# Patient Record
Sex: Female | Born: 1985
Health system: Southern US, Community
[De-identification: ages and names within clinical notes are randomized; demographics above are authoritative.]

## PROBLEM LIST (undated history)

## (undated) DIAGNOSIS — F419 Anxiety disorder, unspecified: Secondary | ICD-10-CM

## (undated) DIAGNOSIS — F32A Depression, unspecified: Secondary | ICD-10-CM

## (undated) DIAGNOSIS — F329 Major depressive disorder, single episode, unspecified: Secondary | ICD-10-CM

## (undated) HISTORY — DX: Anxiety disorder, unspecified: F41.9

## (undated) HISTORY — DX: Depression, unspecified: F32.A

---
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
## 2011-09-14 DIAGNOSIS — R768 Other specified abnormal immunological findings in serum: Secondary | ICD-10-CM | POA: Insufficient documentation

## 2011-09-14 DIAGNOSIS — R7689 Other specified abnormal immunological findings in serum: Secondary | ICD-10-CM | POA: Insufficient documentation

## 2014-05-22 DIAGNOSIS — D509 Iron deficiency anemia, unspecified: Secondary | ICD-10-CM | POA: Insufficient documentation

## 2017-09-24 DIAGNOSIS — F331 Major depressive disorder, recurrent, moderate: Secondary | ICD-10-CM | POA: Insufficient documentation

## 2018-02-20 DIAGNOSIS — N6320 Unspecified lump in the left breast, unspecified quadrant: Secondary | ICD-10-CM | POA: Insufficient documentation

## 2018-03-28 HISTORY — PX: IR FIBRIN GLUE REPAIR ANAL FISTULA: IMG2325

## 2018-03-31 DIAGNOSIS — K603 Anal fistula: Secondary | ICD-10-CM | POA: Insufficient documentation

## 2018-03-31 DIAGNOSIS — K602 Anal fissure, unspecified: Secondary | ICD-10-CM | POA: Insufficient documentation

## 2019-01-27 DIAGNOSIS — L989 Disorder of the skin and subcutaneous tissue, unspecified: Secondary | ICD-10-CM

## 2019-01-27 HISTORY — DX: Disorder of the skin and subcutaneous tissue, unspecified: L98.9

## 2019-06-17 ENCOUNTER — Encounter: Payer: Self-pay | Admitting: Family Medicine

## 2019-06-17 ENCOUNTER — Ambulatory Visit (INDEPENDENT_AMBULATORY_CARE_PROVIDER_SITE_OTHER): Payer: No Typology Code available for payment source | Admitting: Family Medicine

## 2019-06-17 ENCOUNTER — Other Ambulatory Visit: Payer: Self-pay

## 2019-06-17 VITALS — BP 137/83 | HR 84 | Temp 98.7°F | Ht 64.0 in | Wt 165.0 lb

## 2019-06-17 DIAGNOSIS — E538 Deficiency of other specified B group vitamins: Secondary | ICD-10-CM | POA: Diagnosis not present

## 2019-06-17 DIAGNOSIS — F411 Generalized anxiety disorder: Secondary | ICD-10-CM

## 2019-06-17 DIAGNOSIS — L729 Follicular cyst of the skin and subcutaneous tissue, unspecified: Secondary | ICD-10-CM | POA: Insufficient documentation

## 2019-06-17 MED ORDER — CYANOCOBALAMIN 1000 MCG/ML IJ SOLN: 1000.0000 ug | INTRAMUSCULAR | 12 refills | Status: AC

## 2019-06-17 MED ORDER — PROPRANOLOL HCL ER 60 MG PO CP24: 60.0000 mg | ORAL_CAPSULE | Freq: Every day | ORAL | 1 refills | Status: DC

## 2019-06-17 MED FILL — CYANOCOBALAMIN 1,000 MCG/ML: 1000 | 90 days supply | Qty: 3 | Fill #0

## 2019-06-17 MED FILL — PROPRANOLOL HCL ER 60 MG CP: 60 | 90 days supply | Qty: 90 | Fill #0

## 2019-06-17 NOTE — Progress Notes (Signed)
Emily Russell - 34 y.o. female MRN [id number]  Date of birth: 06-25-1985  Subjective Chief Complaint  Patient presents with  . Establish Care    HPI Emily Russell is a 34 y.o. female with history of anxiety and vitamin b12 deficiency.  She is here for initial visit and needs refills on medications.  She self administers b12 injections monthly.  She is doing well with this but is unsure when last levels were checked.    She takes propranolol as needed for anxiety.  This is working well at this time.   She also has area adjacent to R eye that she is concerned about.  First appeared last year with swelling and redness.  Thought to be a cyst and treated with abx.  This improved but never fully resolved.  Still has pain from time to time.  Denies increased swelling.  She would like to consider having this removed.   ROS:  A comprehensive ROS was completed and negative except as noted per HPI  No Known Allergies  Past Medical History:  Diagnosis Date  . Anxiety   . Bumps on skin 01/2019   face near right eye.  . Depression     Past Surgical History:  Procedure Laterality Date  . IR FIBRIN GLUE REPAIR ANAL FISTULA  2020    Social History   Socioeconomic History  . Marital status: Married    Spouse name: Not on file  . Number of children: Not on file  . Years of education: Not on file  . Highest education level: Not on file  Occupational History  . Not on file  Tobacco Use  . Smoking status: Never Smoker  . Smokeless tobacco: Never Used  Substance and Sexual Activity  . Alcohol use: Not Currently  . Drug use: Never  . Sexual activity: Yes    Partners: Male    Birth control/protection: Condom  Other Topics Concern  . Not on file  Social History Narrative  . Not on file   Social Determinants of Health   Financial Resource Strain:   . Difficulty of Paying Living Expenses:   Food Insecurity:   . Worried About Charity fundraiser in the Last Year:   . Arboriculturist  in the Last Year:   Transportation Needs:   . Film/video editor (Medical):   Marland Kitchen Lack of Transportation (Non-Medical):   Physical Activity:   . Days of Exercise per Week:   . Minutes of Exercise per Session:   Stress:   . Feeling of Stress :   Social Connections:   . Frequency of Communication with Friends and Family:   . Frequency of Social Gatherings with Friends and Family:   . Attends Religious Services:   . Active Member of Clubs or Organizations:   . Attends Archivist Meetings:   Marland Kitchen Marital Status:     Family History  Problem Relation Age of Onset  . Hypertension Mother   . Lupus Father   . Stroke Paternal Grandmother   . Diabetes Paternal Grandfather     Health Maintenance  Topic Date Due  . PAP SMEAR-Modifier  03/28/2020  . TETANUS/TDAP  06/16/2025  . INFLUENZA VACCINE  Completed  . HIV Screening  Completed     ----------------------------------------------------------------------------------------------------------------------------------------------------------------------------------------------------------------- Physical Exam BP 137/83   Pulse 84   Temp 98.7 F (37.1 C) (Oral)   Ht 5\' 4"  (1.626 m)   Wt 165 lb (74.8 kg)   LMP 06/04/2019  BMI 28.32 kg/m   Physical Exam Constitutional:      Appearance: Normal appearance.  Eyes:     General: No scleral icterus. Cardiovascular:     Rate and Rhythm: Normal rate.  Pulmonary:     Effort: Pulmonary effort is normal.     Breath sounds: Normal breath sounds.  Musculoskeletal:     Cervical back: Neck supple.  Skin:    General: Skin is warm and dry.     Comments: Small cystic like lesion adjacent to R eye.  Mild ttp to palpation.  No induration or fluctuance.    Neurological:     Mental Status: She is alert.  Psychiatric:        Mood and Affect: Mood normal.        Behavior: Behavior normal.      ------------------------------------------------------------------------------------------------------------------------------------------------------------------------------------------------------------------- Assessment and Plan  Cyst of skin She desires removal due to recurrent pain and irritation.  Referral entered to plastic surgery.   B12 deficiency Due for updated labs.  B12, MMA and Homocysteine ordered  Renewal for B12 for injection sent in.   GAD (generalized anxiety disorder) This is well controlled with propranolol, continue at current dose.  Rx renewed.    Meds ordered this encounter  Medications  . propranolol ER (INDERAL LA) 60 MG 24 hr capsule    Sig: Take 1 capsule (60 mg total) by mouth daily.    Dispense:  90 capsule    Refill:  1  . cyanocobalamin (,VITAMIN B-12,) 1000 MCG/ML injection    Sig: Inject 1 mL (1,000 mcg total) into the muscle every 30 (thirty) days.    Dispense:  1 mL    Refill:  12    No follow-ups on file.    This visit occurred during the SARS-CoV-2 public health emergency.  Safety protocols were in place, including screening questions prior to the visit, additional usage of staff PPE, and extensive cleaning of exam room while observing appropriate contact time as indicated for disinfecting solutions.

## 2019-06-17 NOTE — Assessment & Plan Note (Signed)
Due for updated labs.  B12, MMA and Homocysteine ordered  Renewal for B12 for injection sent in.

## 2019-06-17 NOTE — Patient Instructions (Signed)
Great to meet you! We'll be in touch with lab results You should be contacted by plastic surgery to area on eye removed.

## 2019-06-17 NOTE — Assessment & Plan Note (Signed)
She desires removal due to recurrent pain and irritation.  Referral entered to plastic surgery.

## 2019-06-17 NOTE — Assessment & Plan Note (Signed)
This is well controlled with propranolol, continue at current dose.  Rx renewed.

## 2019-06-20 LAB — HOMOCYSTEINE: Homocysteine: 8.9 umol/L (ref <10.4)

## 2019-06-20 LAB — METHYLMALONIC ACID, SERUM: Methylmalonic Acid, Quant: 151 nmol/L (ref 87–318)

## 2019-06-20 LAB — VITAMIN B12: Vitamin B-12: 527 pg/mL (ref 200–1100)

## 2019-06-27 ENCOUNTER — Encounter: Payer: Self-pay | Admitting: Family Medicine

## 2019-07-17 ENCOUNTER — Institutional Professional Consult (permissible substitution): Payer: No Typology Code available for payment source | Admitting: Plastic Surgery

## 2019-08-14 ENCOUNTER — Other Ambulatory Visit: Payer: Self-pay

## 2019-08-14 ENCOUNTER — Encounter: Payer: Self-pay | Admitting: Plastic Surgery

## 2019-08-14 ENCOUNTER — Ambulatory Visit (INDEPENDENT_AMBULATORY_CARE_PROVIDER_SITE_OTHER): Payer: No Typology Code available for payment source | Admitting: Plastic Surgery

## 2019-08-14 VITALS — BP 125/86 | HR 83 | Temp 97.8°F | Ht 64.0 in | Wt 163.4 lb

## 2019-08-14 DIAGNOSIS — L989 Disorder of the skin and subcutaneous tissue, unspecified: Secondary | ICD-10-CM | POA: Diagnosis not present

## 2019-08-14 NOTE — Progress Notes (Signed)
   Referring Provider Luetta Nutting, [address] [place],  [place] [id number]   CC:  Chief Complaint  Patient presents with  . Consult    cyst on right side of face      Emily Russell is an 34 y.o. female.  HPI: Patient presents to discuss a cyst in the right lateral canthus area.  Is been present for least 6 months.  Was initially bigger and then she got a course of antibiotics and his decreased in size but it still bothers her.  Intermittently painful.  No Known Allergies  Outpatient Encounter Medications as of 08/14/2019  Medication Sig  . Ascorbic Acid (VITAMIN C) 1000 MG tablet Take 1,100 mg by mouth daily.  . Cholecalciferol 125 MCG (5000 UT) capsule Take 5,000 Units by mouth daily.  . cyanocobalamin (,VITAMIN B-12,) 1000 MCG/ML injection Inject 1 mL (1,000 mcg total) into the muscle every 30 (thirty) days.  . Menaquinone-7 (VITAMIN K2) 100 MCG CAPS Take 1 capsule by mouth daily.  . Omega-3 Fatty Acids (FISH OIL) 1000 MG CAPS Take 1 capsule by mouth daily.  . propranolol ER (INDERAL LA) 60 MG 24 hr capsule Take 1 capsule (60 mg total) by mouth daily.  . Turmeric (QC TUMERIC COMPLEX PO) Take 1 tablet by mouth daily.   No facility-administered encounter medications on file as of 08/14/2019.     Past Medical History:  Diagnosis Date  . Anxiety   . Bumps on skin 01/2019   face near right eye.  . Depression     Past Surgical History:  Procedure Laterality Date  . IR FIBRIN GLUE REPAIR ANAL FISTULA  2020    Family History  Problem Relation Age of Onset  . Hypertension Mother   . Lupus Father   . Stroke Paternal Grandmother   . Diabetes Paternal Grandfather     Social History   Social History Narrative  . Not on file     Review of Systems General: Denies fevers, chills, weight loss CV: Denies chest pain, shortness of breath, palpitations  Physical Exam Vitals with BMI 08/14/2019 06/17/2019  Height 5\' 4"  5\' 4"   Weight 163 lbs 6 oz  165 lbs  BMI [id number]  Systolic 0000000 0000000  Diastolic 86 83  Pulse 83 84    General:  No acute distress,  Alert and oriented, Non-Toxic, Normal speech and affect Patient has a 1 cm diameter subcutaneous cystic lesion in the right lateral canthus/zygomatic area.  There is some erythematous skin superficially.  There is no drainage or obvious punctum that I can see.  Assessment/Plan Patient presents with what looks like a benign epidermal inclusion cyst.  I explained that excision would be the only treatment that would be definitive in my opinion.  I think it is a great time to do it now as it is in a quiesced sent phase prior to it increasing in size again.  I discussed the risk that include bleeding, infection, damage to surrounding structures, and need for additional procedures.  I discussed the potential for recurrence.  We will plan to organize this to be done under local.  Cindra Presume 08/14/2019, 3:41 PM

## 2019-08-19 ENCOUNTER — Other Ambulatory Visit: Payer: Self-pay

## 2019-08-19 ENCOUNTER — Other Ambulatory Visit (HOSPITAL_COMMUNITY)
Admission: RE | Admit: 2019-08-19 | Discharge: 2019-08-19 | Disposition: A | Discharge status: Home / Self Care | Payer: No Typology Code available for payment source | Source: Ambulatory Visit | Attending: Plastic Surgery | Admitting: Plastic Surgery

## 2019-08-19 ENCOUNTER — Ambulatory Visit (INDEPENDENT_AMBULATORY_CARE_PROVIDER_SITE_OTHER): Payer: No Typology Code available for payment source | Admitting: Plastic Surgery

## 2019-08-19 DIAGNOSIS — L989 Disorder of the skin and subcutaneous tissue, unspecified: Secondary | ICD-10-CM

## 2019-08-19 NOTE — Progress Notes (Signed)
Operative Note   DATE OF OPERATION: 08/19/2019  LOCATION:    SURGICAL DEPARTMENT: Plastic Surgery  PREOPERATIVE DIAGNOSES: Right cheek cyst  POSTOPERATIVE DIAGNOSES:  same  PROCEDURE:  1. Excision of right cheek cyst measuring 2 cm 2. Complex closure measuring 2 cm  SURGEON: Talmadge Coventry, MD  ANESTHESIA:  Local  COMPLICATIONS: None.   INDICATIONS FOR PROCEDURE:  The patient, Emily Russell is a 34 y.o. female born on Mar 17, 1986, is here for treatment of right cheek cyst MRN: [id number]  CONSENT:  Informed consent was obtained directly from the patient. Risks, benefits and alternatives were fully discussed. Specific risks including but not limited to bleeding, infection, hematoma, seroma, scarring, pain, infection, wound healing problems, and need for further surgery were all discussed. The patient did have an ample opportunity to have questions answered to satisfaction.   DESCRIPTION OF PROCEDURE:  Local anesthesia was administered. The patient's operative site was prepped and draped in a sterile fashion. A time out was performed and all information was confirmed to be correct.  The lesion was excised with a 15 blade.  Hemostasis was obtained.  Circumferential undermining was performed and the skin was advanced and closed in layers with interrupted buried Monocryl sutures and 5-0 fast gut for the skin.  The lesion excised measured 2 cm, and the total length of closure measured 2 cm.    The patient tolerated the procedure well.  There were no complications.

## 2019-08-21 LAB — SURGICAL PATHOLOGY

## 2019-08-29 ENCOUNTER — Telehealth: Payer: Self-pay | Admitting: *Deleted

## 2019-08-29 NOTE — Telephone Encounter (Signed)
Received Medical Management Request via of fax from [facility] on (08/26/19). To be completed.  Given to provider to complete.   Request completed and faxed.  Confirmation received.  Copy scanned into the chart.//AB/CMA

## 2019-09-02 NOTE — Progress Notes (Signed)
Patient is a 34 year old female who underwent excision of right cheek cyst (2 cm) with complex closure on 08/19/2019 with Dr. Claudia Desanctis.  Pathology results: Epidermoid inclusion cyst with rupture.  There is no evidence of malignancy.  ~2 weeks PO Patient reports no issues at this time. Incision healing well,c/d/i. No signs of infection.  Follow up as needed. Call office with any questions/concerns.

## 2019-09-04 ENCOUNTER — Encounter: Payer: Self-pay | Admitting: Plastic Surgery

## 2019-09-04 ENCOUNTER — Ambulatory Visit (INDEPENDENT_AMBULATORY_CARE_PROVIDER_SITE_OTHER): Payer: No Typology Code available for payment source | Admitting: Plastic Surgery

## 2019-09-04 ENCOUNTER — Other Ambulatory Visit: Payer: Self-pay

## 2019-09-04 VITALS — BP 120/83 | HR 79 | Temp 97.8°F

## 2019-09-04 DIAGNOSIS — L729 Follicular cyst of the skin and subcutaneous tissue, unspecified: Secondary | ICD-10-CM

## 2019-09-05 ENCOUNTER — Encounter: Payer: Self-pay | Admitting: Plastic Surgery

## 2019-09-11 ENCOUNTER — Other Ambulatory Visit: Payer: Self-pay

## 2019-09-11 ENCOUNTER — Encounter: Payer: Self-pay | Admitting: Family Medicine

## 2019-09-11 ENCOUNTER — Ambulatory Visit (INDEPENDENT_AMBULATORY_CARE_PROVIDER_SITE_OTHER): Payer: No Typology Code available for payment source | Admitting: Family Medicine

## 2019-09-11 VITALS — BP 134/77 | Wt 164.0 lb

## 2019-09-11 DIAGNOSIS — R197 Diarrhea, unspecified: Secondary | ICD-10-CM | POA: Diagnosis not present

## 2019-09-11 DIAGNOSIS — R109 Unspecified abdominal pain: Secondary | ICD-10-CM

## 2019-09-11 MED ORDER — DICYCLOMINE HCL 10 MG PO CAPS: 10.0000 mg | ORAL_CAPSULE | Freq: Three times a day (TID) | ORAL | 0 refills | Status: AC | PRN

## 2019-09-11 MED FILL — DICYCLOMINE 10 MG CAPSULE: 10 | 20 days supply | Qty: 60 | Fill #0

## 2019-09-11 NOTE — Patient Instructions (Signed)
Nice to see you! Have labs completed, we'll be in touch with results.  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  Try dicyclomine for cramps if symptoms re-occur

## 2019-09-11 NOTE — Progress Notes (Signed)
Emily Russell - 34 y.o. female MRN [id number]  Date of birth: 12-Jan-1986  Subjective Chief Complaint  Patient presents with  . Abdominal Pain    HPI Emily Russell is a 34 y.o. female here today with complaint of recurrent abdominal pain and diarrhea. She reports that symptoms started after COVID-19 infection in December.  She has had episodes of abdominal cramping with diarrhea nearly every month since having COVID.  She had some blood in her stool after she had first episode in December and had GI profile that returned negative.  She does have some associated nausea sometimes.  She denies fever, chills, weight change or changes to appetite. She has not had blood in her stool since initial episode.    ROS:  A comprehensive ROS was completed and negative except as noted per HPI   No Known Allergies  Past Medical History:  Diagnosis Date  . Anxiety   . Bumps on skin 01/2019   face near right eye.  . Depression     Past Surgical History:  Procedure Laterality Date  . IR FIBRIN GLUE REPAIR ANAL FISTULA  2020    Social History   Socioeconomic History  . Marital status: Married    Spouse name: Not on file  . Number of children: Not on file  . Years of education: Not on file  . Highest education level: Not on file  Occupational History  . Not on file  Tobacco Use  . Smoking status: Never Smoker  . Smokeless tobacco: Never Used  Substance and Sexual Activity  . Alcohol use: Not Currently  . Drug use: Never  . Sexual activity: Yes    Partners: Male    Birth control/protection: Condom  Other Topics Concern  . Not on file  Social History Narrative  . Not on file   Social Determinants of Health   Financial Resource Strain:   . Difficulty of Paying Living Expenses:   Food Insecurity:   . Worried About Charity fundraiser in the Last Year:   . Arboriculturist in the Last Year:   Transportation Needs:   . Film/video editor (Medical):   Marland Kitchen Lack of Transportation  (Non-Medical):   Physical Activity:   . Days of Exercise per Week:   . Minutes of Exercise per Session:   Stress:   . Feeling of Stress :   Social Connections:   . Frequency of Communication with Friends and Family:   . Frequency of Social Gatherings with Friends and Family:   . Attends Religious Services:   . Active Member of Clubs or Organizations:   . Attends Archivist Meetings:   Marland Kitchen Marital Status:     Family History  Problem Relation Age of Onset  . Hypertension Mother   . Lupus Father   . Stroke Paternal Grandmother   . Diabetes Paternal Grandfather     Health Maintenance  Topic Date Due  . Hepatitis C Screening  Never done  . INFLUENZA VACCINE  10/27/2019  . PAP SMEAR-Modifier  03/28/2020  . TETANUS/TDAP  06/16/2025  . HIV Screening  Completed     ----------------------------------------------------------------------------------------------------------------------------------------------------------------------------------------------------------------- Physical Exam BP 134/77   Wt 164 lb (74.4 kg)   LMP 08/21/2019 (Approximate)   SpO2 100%   BMI 28.15 kg/m   Physical Exam Constitutional:      Appearance: She is well-developed.  HENT:     Head: Normocephalic and atraumatic.  Eyes:     General: No scleral icterus. Cardiovascular:  Rate and Rhythm: Normal rate and regular rhythm.  Pulmonary:     Effort: Pulmonary effort is normal.     Breath sounds: Normal breath sounds.  Abdominal:     General: Abdomen is flat. There is no distension.     Palpations: Abdomen is soft.     Tenderness: There is no abdominal tenderness. There is no guarding.  Musculoskeletal:     Cervical back: Neck supple.  Skin:    General: Skin is warm and dry.  Neurological:     General: No focal deficit present.     Mental Status: She is alert.  Psychiatric:        Mood and Affect: Mood normal.      ------------------------------------------------------------------------------------------------------------------------------------------------------------------------------------------------------------------- Assessment and Plan  Recurrent abdominal pain Associated with diarrhea Checking inflammatory markers including ESR, crp and calprotectin.  Will check for infectious etiology including stool culture, c.diff and O&P.  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 Will add bentyl as needed for cramping.  Next step would be GI referral if labs return normal.     Meds ordered this encounter  Medications  . dicyclomine (BENTYL) 10 MG capsule    Sig: Take 1 capsule (10 mg total) by mouth 3 (three) times daily as needed for spasms.    Dispense:  60 capsule    Refill:  0    No follow-ups on file.    This visit occurred during the SARS-CoV-2 public health emergency.  Safety protocols were in place, including screening questions prior to the visit, additional usage of staff PPE, and extensive cleaning of exam room while observing appropriate contact time as indicated for disinfecting solutions.

## 2019-09-11 NOTE — Assessment & Plan Note (Signed)
Associated with diarrhea Checking inflammatory markers including ESR, crp and calprotectin.  Will check for infectious etiology including stool culture, c.diff and O&P.  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 Will add bentyl as needed for cramping.  Next step would be GI referral if labs return normal.

## 2019-09-12 LAB — SEDIMENTATION RATE: Sed Rate: 2 mm/h (ref 0–20)

## 2019-09-12 LAB — C-REACTIVE PROTEIN: CRP: 0.6 mg/L (ref <8.0)

## 2019-09-19 LAB — STOOL CULTURE
MICRO NUMBER:: 10604948
MICRO NUMBER:: 10604949
MICRO NUMBER:: 10604950
SHIGA RESULT:: NOT DETECTED
SPECIMEN QUALITY:: ADEQUATE
SPECIMEN QUALITY:: ADEQUATE
SPECIMEN QUALITY:: ADEQUATE

## 2019-09-19 LAB — OVA AND PARASITE EXAMINATION
CONCENTRATE RESULT:: NONE SEEN
MICRO NUMBER:: 10604246
SPECIMEN QUALITY:: ADEQUATE
TRICHROME RESULT:: NONE SEEN

## 2019-09-19 LAB — C. DIFFICILE GDH AND TOXIN A/B
GDH ANTIGEN: NOT DETECTED
MICRO NUMBER:: 10606708
SPECIMEN QUALITY:: ADEQUATE
TOXIN A AND B: NOT DETECTED

## 2019-09-19 LAB — CALPROTECTIN: Calprotectin: 6 mcg/g

## 2019-10-21 ENCOUNTER — Encounter: Payer: Self-pay | Admitting: Family Medicine

## 2020-02-11 ENCOUNTER — Other Ambulatory Visit: Payer: Self-pay

## 2020-02-11 ENCOUNTER — Encounter: Payer: Self-pay | Admitting: Family Medicine

## 2020-02-11 ENCOUNTER — Ambulatory Visit (INDEPENDENT_AMBULATORY_CARE_PROVIDER_SITE_OTHER): Payer: No Typology Code available for payment source | Admitting: Family Medicine

## 2020-02-11 VITALS — BP 135/78 | HR 87 | Temp 98.0°F | Wt 164.3 lb

## 2020-02-11 DIAGNOSIS — R002 Palpitations: Secondary | ICD-10-CM

## 2020-02-11 NOTE — Progress Notes (Signed)
Emily Russell - 34 y.o. female MRN [id number]  Date of birth: [birth year]  Subjective Chief Complaint  Patient presents with  . Palpitations    HPI Emily Russell is a 34 y.o. female here today with complaint of palpitations.  She has had these before and had EKG with holter monitor in 2017.  She had occasional PVC's on holter.  She describes current symptoms as occasional flutter.  No tachycardia symptoms, dizziness, shortness of breath, or nausea.  Feels like she has to clear her throat when she has these.  She has been prescribed propranolol previously as needed for this and anxiety but hasn't been using recently.  She does not consume very many caffeine containing products, doesn't feel anxious and is sleeping well.    ROS:  A comprehensive ROS was completed and negative except as noted per HPI  No Known Allergies  Past Medical History:  Diagnosis Date  . Anxiety   . Bumps on skin 01/2019   face near right eye.  . Depression     Past Surgical History:  Procedure Laterality Date  . IR FIBRIN GLUE REPAIR ANAL FISTULA  2020    Social History   Socioeconomic History  . Marital status: Married    Spouse name: Not on file  . Number of children: Not on file  . Years of education: Not on file  . Highest education level: Not on file  Occupational History  . Not on file  Tobacco Use  . Smoking status: Never Smoker  . Smokeless tobacco: Never Used  Substance and Sexual Activity  . Alcohol use: Not Currently  . Drug use: Never  . Sexual activity: Yes    Partners: Male    Birth control/protection: Condom  Other Topics Concern  . Not on file  Social History Narrative  . Not on file   Social Determinants of Health   Financial Resource Strain:   . Difficulty of Paying Living Expenses: Not on file  Food Insecurity:   . Worried About Charity fundraiser in the Last Year: Not on file  . Ran Out of Food in the Last Year: Not on file  Transportation Needs:   . Lack of  Transportation (Medical): Not on file  . Lack of Transportation (Non-Medical): Not on file  Physical Activity:   . Days of Exercise per Week: Not on file  . Minutes of Exercise per Session: Not on file  Stress:   . Feeling of Stress : Not on file  Social Connections:   . Frequency of Communication with Friends and Family: Not on file  . Frequency of Social Gatherings with Friends and Family: Not on file  . Attends Religious Services: Not on file  . Active Member of Clubs or Organizations: Not on file  . Attends Archivist Meetings: Not on file  . Marital Status: Not on file    Family History  Problem Relation Age of Onset  . Hypertension Mother   . Lupus Father   . Stroke Paternal Grandmother   . Diabetes Paternal Grandfather     Health Maintenance  Topic Date Due  . Hepatitis C Screening  Never done  . INFLUENZA VACCINE  10/27/2019  . PAP SMEAR-Modifier  03/28/2020  . TETANUS/TDAP  06/16/2025  . HIV Screening  Completed     ----------------------------------------------------------------------------------------------------------------------------------------------------------------------------------------------------------------- Physical Exam BP 135/78 (BP Location: Left Arm, Patient Position: Sitting, Cuff Size: Normal)   Pulse 87   Temp 98 F (36.7 C)   Wt 164  lb 4.8 oz (74.5 kg)   SpO2 100%   BMI 28.20 kg/m   Physical Exam Constitutional:      Appearance: Normal appearance.  HENT:     Head: Normocephalic and atraumatic.  Eyes:     General: No scleral icterus. Cardiovascular:     Rate and Rhythm: Normal rate and regular rhythm.     Comments: Occasional ectopic beat  Pulmonary:     Effort: Pulmonary effort is normal.     Breath sounds: Normal breath sounds.  Skin:    General: Skin is warm and dry.  Neurological:     General: No focal deficit present.     Mental Status: She is alert.  Psychiatric:        Mood and Affect: Mood normal.         Behavior: Behavior normal.    EKG: NSR, rate of 90.  Normal PR and QTc intervals.  ------------------------------------------------------------------------------------------------------------------------------------------------------------------------------------------------------------------- Assessment and Plan  Palpitations Check cmp, cbc and tsh today.  EKG reassuring  She will use propranolol as needed.  Follow up if remains persistent or occurs more often.     No orders of the defined types were placed in this encounter.   No follow-ups on file.    This visit occurred during the SARS-CoV-2 public health emergency.  Safety protocols were in place, including screening questions prior to the visit, additional usage of staff PPE, and extensive cleaning of exam room while observing appropriate contact time as indicated for disinfecting solutions.

## 2020-02-11 NOTE — Assessment & Plan Note (Signed)
Check cmp, cbc and tsh today.  EKG reassuring  She will use propranolol as needed.  Follow up if remains persistent or occurs more often.

## 2020-02-11 NOTE — Patient Instructions (Signed)
Have labs completed Try propranolol as needed.  Let me know if this becomes worse or continuous.

## 2020-02-12 LAB — CBC
HCT: 37 % (ref 35.0–45.0)
Hemoglobin: 12.2 g/dL (ref 11.7–15.5)
MCH: 27.1 pg (ref 27.0–33.0)
MCHC: 33 g/dL (ref 32.0–36.0)
MCV: 82.2 fL (ref 80.0–100.0)
MPV: 10.5 fL (ref 7.5–12.5)
Platelets: 286 10*3/uL (ref 140–400)
RBC: 4.5 10*6/uL (ref 3.80–5.10)
RDW: 13 % (ref 11.0–15.0)
WBC: 5.3 10*3/uL (ref 3.8–10.8)

## 2020-02-12 LAB — COMPLETE METABOLIC PANEL WITH GFR
AG Ratio: 1.9 (calc) (ref 1.0–2.5)
ALT: 8 U/L (ref 6–29)
AST: 10 U/L (ref 10–30)
Albumin: 4.2 g/dL (ref 3.6–5.1)
Alkaline phosphatase (APISO): 46 U/L (ref 31–125)
BUN: 9 mg/dL (ref 7–25)
CO2: 27 mmol/L (ref 20–32)
Calcium: 9.3 mg/dL (ref 8.6–10.2)
Chloride: 106 mmol/L (ref 98–110)
Creat: 0.78 mg/dL (ref 0.50–1.10)
GFR, Est African American: 115 mL/min/{1.73_m2} (ref >=60)
GFR, Est Non African American: 99 mL/min/{1.73_m2} (ref >=60)
Globulin: 2.2 g/dL (calc) (ref 1.9–3.7)
Glucose, Bld: 96 mg/dL (ref 65–99)
Potassium: 4.4 mmol/L (ref 3.5–5.3)
Sodium: 140 mmol/L (ref 135–146)
Total Bilirubin: 0.4 mg/dL (ref 0.2–1.2)
Total Protein: 6.4 g/dL (ref 6.1–8.1)

## 2020-02-12 LAB — TSH: TSH: 0.72 mIU/L

## 2020-02-12 NOTE — Addendum Note (Signed)
Addended by: Narda Rutherford on: 02/12/2020 07:33 AM   Modules accepted: Orders

## 2020-02-17 ENCOUNTER — Encounter: Payer: Self-pay | Admitting: Family Medicine

## 2020-02-17 ENCOUNTER — Other Ambulatory Visit: Payer: Self-pay

## 2020-02-17 MED ORDER — PROPRANOLOL HCL ER 60 MG PO CP24: 60.0000 mg | ORAL_CAPSULE | Freq: Every day | ORAL | 1 refills | Status: AC

## 2020-02-17 MED ORDER — PROPRANOLOL HCL ER 60 MG PO CP24
60.0000 mg | ORAL_CAPSULE | Freq: Every day | ORAL | 1 refills | Status: DC
Start: 2020-02-17 — End: 2020-02-17

## 2020-03-09 ENCOUNTER — Other Ambulatory Visit: Payer: Self-pay | Admitting: Sports Medicine

## 2020-03-09 ENCOUNTER — Ambulatory Visit (INDEPENDENT_AMBULATORY_CARE_PROVIDER_SITE_OTHER): Payer: No Typology Code available for payment source

## 2020-03-09 ENCOUNTER — Ambulatory Visit (INDEPENDENT_AMBULATORY_CARE_PROVIDER_SITE_OTHER): Payer: No Typology Code available for payment source | Admitting: Sports Medicine

## 2020-03-09 ENCOUNTER — Other Ambulatory Visit: Payer: Self-pay

## 2020-03-09 DIAGNOSIS — M79601 Pain in right arm: Secondary | ICD-10-CM

## 2020-03-09 DIAGNOSIS — M7521 Bicipital tendinitis, right shoulder: Secondary | ICD-10-CM | POA: Insufficient documentation

## 2020-03-09 DIAGNOSIS — M50322 Other cervical disc degeneration at C5-C6 level: Secondary | ICD-10-CM | POA: Diagnosis not present

## 2020-03-09 DIAGNOSIS — M25521 Pain in right elbow: Secondary | ICD-10-CM | POA: Diagnosis not present

## 2020-03-09 DIAGNOSIS — M25511 Pain in right shoulder: Secondary | ICD-10-CM | POA: Diagnosis not present

## 2020-03-09 MED ORDER — MELOXICAM 15 MG PO TABS: ORAL_TABLET | ORAL | 3 refills | Status: DC

## 2020-03-09 MED ORDER — MELOXICAM 15 MG PO TABS: ORAL_TABLET | ORAL | 3 refills | Status: AC

## 2020-03-09 MED FILL — MELOXICAM 15 MG TABLET: 15 | 30 days supply | Qty: 30 | Fill #0

## 2020-03-09 NOTE — Addendum Note (Signed)
Addended by: Silverio Decamp on: 03/09/2020 03:16 PM   Modules accepted: Orders

## 2020-03-09 NOTE — Progress Notes (Signed)
    Procedures performed today:    None.  Independent interpretation of notes and tests performed by another provider:   None.  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 For the past 6 weeks she has had pain in her anterior upper arm with radiation up to the shoulder and distally to the wrist. Her exam is almost completely benign, neck exam is unremarkable, negative Spurling sign, good motion, good strength. Shoulder exam is unremarkable with the exception of a very mildly positive speeds test. Elbow exam is unremarkable with the exception of a mildly positive speeds test. Wrist exam is completely unremarkable, negative Tinel's, Phalen sign, good motion, good strength, negative Finkelstein test. I do think she has very mild distal biceps tendinitis, we talked about ergonomic improvements to her workstation, I will add meloxicam, neck, shoulder, elbow x-rays, as well as biceps tendinitis rehab exercises, return to see me in 4 to 6 weeks, MRI of either the shoulder or elbow if no better.    ___________________________________________ Gwen Her. Dianah Field, M.D., ABFM., CAQSM. Primary Care and [place] [employer] of [employer] [facility]

## 2020-03-09 NOTE — Assessment & Plan Note (Signed)
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 For the past 6 weeks she has had pain in her anterior upper arm with radiation up to the shoulder and distally to the wrist. Her exam is almost completely benign, neck exam is unremarkable, negative Spurling sign, good motion, good strength. Shoulder exam is unremarkable with the exception of a very mildly positive speeds test. Elbow exam is unremarkable with the exception of a mildly positive speeds test. Wrist exam is completely unremarkable, negative Tinel's, Phalen sign, good motion, good strength, negative Finkelstein test. I do think she has very mild distal biceps tendinitis, we talked about ergonomic improvements to her workstation, I will add meloxicam, neck, shoulder, elbow x-rays, as well as biceps tendinitis rehab exercises, return to see me in 4 to 6 weeks, MRI of either the shoulder or elbow if no better.

## 2020-04-06 ENCOUNTER — Ambulatory Visit: Payer: No Typology Code available for payment source | Admitting: Sports Medicine

## 2020-04-10 ENCOUNTER — Ambulatory Visit: Payer: Self-pay | Admitting: Sports Medicine

## 2020-04-13 ENCOUNTER — Ambulatory Visit: Payer: Self-pay | Admitting: Sports Medicine

## 2020-04-17 ENCOUNTER — Ambulatory Visit: Payer: Self-pay | Admitting: Sports Medicine

## 2020-04-28 ENCOUNTER — Other Ambulatory Visit: Payer: Self-pay

## 2020-04-28 ENCOUNTER — Ambulatory Visit (INDEPENDENT_AMBULATORY_CARE_PROVIDER_SITE_OTHER): Payer: 59 | Admitting: Sports Medicine

## 2020-04-28 ENCOUNTER — Ambulatory Visit (INDEPENDENT_AMBULATORY_CARE_PROVIDER_SITE_OTHER): Payer: 59

## 2020-04-28 DIAGNOSIS — M7521 Bicipital tendinitis, right shoulder: Secondary | ICD-10-CM

## 2020-04-28 DIAGNOSIS — M79601 Pain in right arm: Secondary | ICD-10-CM

## 2020-04-28 NOTE — Progress Notes (Signed)
    Procedures performed today:    Procedure: Real-time Ultrasound Guided injection of the right proximal biceps sheath Device: Samsung HS60  Verbal informed consent obtained.  Time-out conducted.  Noted no overlying erythema, induration, or other signs of local infection.  Skin prepped in a sterile fashion.  Local anesthesia: Topical Ethyl chloride.  With sterile technique and under real time ultrasound guidance:  Noted normal-appearing bicipital groove, 1 cc Kenalog 40, 1 cc lidocaine, 1 cc bupivacaine injected easily Completed without difficulty  Advised to call if fevers/chills, erythema, induration, drainage, or persistent bleeding.  Images permanently stored and available for review in PACS.  Impression: Technically successful ultrasound guided injection.  Independent interpretation of notes and tests performed by another provider:   None.  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 We went through some conservative treatment meloxicam, x-rays, continues to have pain. Today she has a more strongly positive speeds test, I did perform a proximal biceps sheath injection today, return to see me in a month.    ___________________________________________ Gwen Her. Dianah Field, M.D., ABFM., CAQSM. Primary Care and [place] Instructor of [place] [facility]

## 2020-04-28 NOTE — Assessment & Plan Note (Signed)
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 We went through some conservative treatment meloxicam, x-rays, continues to have pain. Today she has a more strongly positive speeds test, I did perform a proximal biceps sheath injection today, return to see me in a month.

## 2020-05-11 DIAGNOSIS — R5383 Other fatigue: Secondary | ICD-10-CM | POA: Diagnosis not present

## 2020-05-11 DIAGNOSIS — E639 Nutritional deficiency, unspecified: Secondary | ICD-10-CM | POA: Diagnosis not present

## 2020-05-11 DIAGNOSIS — E559 Vitamin D deficiency, unspecified: Secondary | ICD-10-CM | POA: Diagnosis not present

## 2020-05-15 DIAGNOSIS — J029 Acute pharyngitis, unspecified: Secondary | ICD-10-CM | POA: Diagnosis not present

## 2020-05-15 DIAGNOSIS — R0982 Postnasal drip: Secondary | ICD-10-CM | POA: Diagnosis not present

## 2020-05-19 ENCOUNTER — Other Ambulatory Visit: Payer: Self-pay

## 2020-05-19 ENCOUNTER — Encounter: Payer: Self-pay | Admitting: Medical-Surgical

## 2020-05-19 ENCOUNTER — Ambulatory Visit (INDEPENDENT_AMBULATORY_CARE_PROVIDER_SITE_OTHER): Payer: 59 | Admitting: Medical-Surgical

## 2020-05-19 VITALS — BP 141/91 | HR 109 | Temp 98.9°F | Resp 20 | Ht 64.0 in | Wt 159.0 lb

## 2020-05-19 DIAGNOSIS — N939 Abnormal uterine and vaginal bleeding, unspecified: Secondary | ICD-10-CM | POA: Diagnosis not present

## 2020-05-19 DIAGNOSIS — N926 Irregular menstruation, unspecified: Secondary | ICD-10-CM

## 2020-05-19 LAB — POCT URINE PREGNANCY: Preg Test, Ur: NEGATIVE

## 2020-05-19 NOTE — Progress Notes (Signed)
Subjective:    CC: prolonged uterine bleeding  HPI: Pleasant 35 year old female presenting with reports of prolonged uterine bleeding. Started her menses 1 week early on 03/29/2020 and has been bleeding since. Started with a light flow that has steadily increased until her flow is heavy, bright red with clots. She is using 2-3 pads per day. Having intense lower abdominal cramps that are debilitating at times, associated with nausea but no vomiting. When lying down, has increased bladder pressure like she has to void but no other urinary symptoms. Sexually active in a monogamous relationship with one female partner but no intercourse since she started bleeding on 1/2. They are not using any birth control. She has used tylenol and motrin for cramping which helps when they are mild but nothing helps when they are severe. Took birth control in her teens but did not tolerate them due to severe nausea. No fevers, chills, or GI symptoms.   I reviewed the past medical history, family history, social history, surgical history, and allergies today and no changes were needed.  Please see the problem list section below in epic for further details.  Past Medical History: Past Medical History:  Diagnosis Date  . Anxiety   . Bumps on skin 01/2019   face near right eye.  . Depression    Past Surgical History: Past Surgical History:  Procedure Laterality Date  . IR FIBRIN GLUE REPAIR ANAL FISTULA  2020   Social History: Social History   Socioeconomic History  . Marital status: Married    Spouse name: Not on file  . Number of children: Not on file  . Years of education: Not on file  . Highest education level: Not on file  Occupational History  . Not on file  Tobacco Use  . Smoking status: Never Smoker  . Smokeless tobacco: Never Used  Substance and Sexual Activity  . Alcohol use: Not Currently  . Drug use: Never  . Sexual activity: Yes    Partners: Male    Birth control/protection: Condom   Other Topics Concern  . Not on file  Social History Narrative  . Not on file   Social Determinants of Health   Financial Resource Strain: Not on file  Food Insecurity: Not on file  Transportation Needs: Not on file  Physical Activity: Not on file  Stress: Not on file  Social Connections: Not on file   Family History: Family History  Problem Relation Age of Onset  . Hypertension Mother   . Lupus Father   . Stroke Paternal Grandmother   . Diabetes Paternal Grandfather    Allergies: No Known Allergies Medications: See med rec.  Review of Systems: See HPI for pertinent positives and negatives.   Objective:    General: Well Developed, well nourished, and in no acute distress.  Neuro: Alert and oriented x3.  HEENT: Normocephalic, atraumatic.  Skin: Warm and dry. Cardiac: Regular rate and rhythm, no murmurs rubs or gallops, no lower extremity edema.  Respiratory: Clear to auscultation bilaterally. Not using accessory muscles, speaking in full sentences.  Impression and Recommendations:    1. Irregular menses/abnormal uterine bleeding POCT UPT negative. Checking CBC, TSH, and prolactin, Getting TVUS. Offered course of progesterone but patient declined. Has an appointment with OB/GYN next week that she will keep.  - POCT urine pregnancy - CBC - TSH - Prolactin - US Pelvic Complete With Transvaginal; Future  Return if symptoms worsen or fail to improve. ___________________________________________ Clearnce Sorrel, DNP, APRN, FNP-BC Primary Care  and [place]

## 2020-05-20 ENCOUNTER — Ambulatory Visit (INDEPENDENT_AMBULATORY_CARE_PROVIDER_SITE_OTHER): Payer: 59

## 2020-05-20 DIAGNOSIS — R102 Pelvic and perineal pain: Secondary | ICD-10-CM | POA: Diagnosis not present

## 2020-05-20 DIAGNOSIS — N939 Abnormal uterine and vaginal bleeding, unspecified: Secondary | ICD-10-CM

## 2020-05-20 LAB — CBC
HCT: 37.4 % (ref 35.0–45.0)
Hemoglobin: 12.6 g/dL (ref 11.7–15.5)
MCH: 27.9 pg (ref 27.0–33.0)
MCHC: 33.7 g/dL (ref 32.0–36.0)
MCV: 82.7 fL (ref 80.0–100.0)
MPV: 10.2 fL (ref 7.5–12.5)
Platelets: 266 10*3/uL (ref 140–400)
RBC: 4.52 10*6/uL (ref 3.80–5.10)
RDW: 12.9 % (ref 11.0–15.0)
WBC: 7.6 10*3/uL (ref 3.8–10.8)

## 2020-05-20 LAB — PROLACTIN: Prolactin: 6.6 ng/mL

## 2020-05-20 LAB — TSH: TSH: 0.67 mIU/L

## 2020-05-25 DIAGNOSIS — R102 Pelvic and perineal pain: Secondary | ICD-10-CM | POA: Diagnosis not present

## 2020-05-25 DIAGNOSIS — G8929 Other chronic pain: Secondary | ICD-10-CM | POA: Diagnosis not present

## 2020-05-25 DIAGNOSIS — Z124 Encounter for screening for malignant neoplasm of cervix: Secondary | ICD-10-CM | POA: Diagnosis not present

## 2020-05-25 DIAGNOSIS — Z113 Encounter for screening for infections with a predominantly sexual mode of transmission: Secondary | ICD-10-CM | POA: Diagnosis not present

## 2020-05-25 DIAGNOSIS — N939 Abnormal uterine and vaginal bleeding, unspecified: Secondary | ICD-10-CM | POA: Diagnosis not present

## 2020-05-25 DIAGNOSIS — Z01419 Encounter for gynecological examination (general) (routine) without abnormal findings: Secondary | ICD-10-CM | POA: Diagnosis not present

## 2020-05-27 ENCOUNTER — Ambulatory Visit: Payer: 59 | Admitting: Sports Medicine

## 2020-06-01 DIAGNOSIS — R102 Pelvic and perineal pain: Secondary | ICD-10-CM | POA: Diagnosis not present

## 2020-06-01 DIAGNOSIS — D251 Intramural leiomyoma of uterus: Secondary | ICD-10-CM | POA: Diagnosis not present

## 2020-06-01 DIAGNOSIS — N939 Abnormal uterine and vaginal bleeding, unspecified: Secondary | ICD-10-CM | POA: Diagnosis not present

## 2020-06-08 ENCOUNTER — Ambulatory Visit: Payer: 59 | Admitting: Sports Medicine

## 2020-06-08 ENCOUNTER — Other Ambulatory Visit: Payer: Self-pay

## 2020-06-08 DIAGNOSIS — M7521 Bicipital tendinitis, right shoulder: Secondary | ICD-10-CM

## 2020-06-08 MED ORDER — TRIAZOLAM 0.25 MG PO TABS: ORAL_TABLET | ORAL | 0 refills | Status: AC

## 2020-06-08 NOTE — Assessment & Plan Note (Signed)
This pleasant 35 year old female continues to have right shoulder pain, localized over the deltoid. We did a biceps sheath injection at the last visit and her anterior shoulder pain has resolved. I think she likely has some rotator cuff disease, due to failure of conservative treatment for greater than 6 weeks including physician directed physical therapy at home and injections regard proceed with MRI with triazolam for preprocedural anxiolysis.

## 2020-06-08 NOTE — Progress Notes (Signed)
    Procedures performed today:    None.  Independent interpretation of notes and tests performed by another provider:   None.  Brief History, Exam, Impression, and Recommendations:    Biceps tendinitis, right This pleasant 35 year old female continues to have right shoulder pain, localized over the deltoid. We did a biceps sheath injection at the last visit and her anterior shoulder pain has resolved. I think she likely has some rotator cuff disease, due to failure of conservative treatment for greater than 6 weeks including physician directed physical therapy at home and injections regard proceed with MRI with triazolam for preprocedural anxiolysis.    ___________________________________________ Gwen Her. Dianah Field, M.D., ABFM., CAQSM. Primary Care and [place] Instructor of [place] [facility]

## 2020-06-13 ENCOUNTER — Ambulatory Visit (INDEPENDENT_AMBULATORY_CARE_PROVIDER_SITE_OTHER): Payer: 59

## 2020-06-13 ENCOUNTER — Other Ambulatory Visit: Payer: Self-pay

## 2020-06-13 DIAGNOSIS — M7521 Bicipital tendinitis, right shoulder: Secondary | ICD-10-CM

## 2020-06-13 DIAGNOSIS — M25511 Pain in right shoulder: Secondary | ICD-10-CM | POA: Diagnosis not present

## 2020-06-16 DIAGNOSIS — M25511 Pain in right shoulder: Secondary | ICD-10-CM | POA: Diagnosis not present

## 2020-06-16 DIAGNOSIS — G8929 Other chronic pain: Secondary | ICD-10-CM | POA: Diagnosis not present

## 2020-06-16 DIAGNOSIS — M25512 Pain in left shoulder: Secondary | ICD-10-CM | POA: Diagnosis not present

## 2020-06-22 ENCOUNTER — Ambulatory Visit: Payer: 59 | Admitting: Sports Medicine

## 2020-06-22 DIAGNOSIS — M5412 Radiculopathy, cervical region: Secondary | ICD-10-CM | POA: Diagnosis not present

## 2020-06-22 DIAGNOSIS — M542 Cervicalgia: Secondary | ICD-10-CM | POA: Diagnosis not present

## 2020-07-08 DIAGNOSIS — M25511 Pain in right shoulder: Secondary | ICD-10-CM | POA: Diagnosis not present

## 2020-07-08 DIAGNOSIS — G8929 Other chronic pain: Secondary | ICD-10-CM | POA: Diagnosis not present

## 2020-07-08 DIAGNOSIS — M25512 Pain in left shoulder: Secondary | ICD-10-CM | POA: Diagnosis not present

## 2020-07-08 DIAGNOSIS — M5412 Radiculopathy, cervical region: Secondary | ICD-10-CM | POA: Diagnosis not present

## 2020-07-17 DIAGNOSIS — G8929 Other chronic pain: Secondary | ICD-10-CM | POA: Diagnosis not present

## 2020-07-17 DIAGNOSIS — M25511 Pain in right shoulder: Secondary | ICD-10-CM | POA: Diagnosis not present

## 2020-07-17 DIAGNOSIS — M25512 Pain in left shoulder: Secondary | ICD-10-CM | POA: Diagnosis not present

## 2020-07-20 DIAGNOSIS — E639 Nutritional deficiency, unspecified: Secondary | ICD-10-CM | POA: Diagnosis not present

## 2020-07-20 DIAGNOSIS — E559 Vitamin D deficiency, unspecified: Secondary | ICD-10-CM | POA: Diagnosis not present

## 2020-07-20 DIAGNOSIS — R202 Paresthesia of skin: Secondary | ICD-10-CM | POA: Diagnosis not present

## 2020-07-20 DIAGNOSIS — D519 Vitamin B12 deficiency anemia, unspecified: Secondary | ICD-10-CM | POA: Diagnosis not present

## 2020-07-21 DIAGNOSIS — N838 Other noninflammatory disorders of ovary, fallopian tube and broad ligament: Secondary | ICD-10-CM | POA: Diagnosis not present

## 2020-07-21 DIAGNOSIS — D251 Intramural leiomyoma of uterus: Secondary | ICD-10-CM | POA: Diagnosis not present

## 2020-07-21 DIAGNOSIS — N939 Abnormal uterine and vaginal bleeding, unspecified: Secondary | ICD-10-CM | POA: Diagnosis not present

## 2020-07-21 DIAGNOSIS — Z888 Allergy status to other drugs, medicaments and biological substances status: Secondary | ICD-10-CM | POA: Diagnosis not present

## 2020-07-21 DIAGNOSIS — N858 Other specified noninflammatory disorders of uterus: Secondary | ICD-10-CM | POA: Diagnosis not present

## 2020-07-21 DIAGNOSIS — K219 Gastro-esophageal reflux disease without esophagitis: Secondary | ICD-10-CM | POA: Diagnosis not present

## 2020-07-21 DIAGNOSIS — F331 Major depressive disorder, recurrent, moderate: Secondary | ICD-10-CM | POA: Diagnosis not present

## 2020-07-21 DIAGNOSIS — F411 Generalized anxiety disorder: Secondary | ICD-10-CM | POA: Diagnosis not present

## 2020-07-21 DIAGNOSIS — N72 Inflammatory disease of cervix uteri: Secondary | ICD-10-CM | POA: Diagnosis not present

## 2020-07-21 DIAGNOSIS — N803 Endometriosis of pelvic peritoneum: Secondary | ICD-10-CM | POA: Diagnosis not present

## 2020-07-21 DIAGNOSIS — Z79899 Other long term (current) drug therapy: Secondary | ICD-10-CM | POA: Diagnosis not present

## 2020-08-01 DIAGNOSIS — M791 Myalgia, unspecified site: Secondary | ICD-10-CM | POA: Diagnosis not present

## 2020-08-02 DIAGNOSIS — N3000 Acute cystitis without hematuria: Secondary | ICD-10-CM | POA: Diagnosis not present

## 2020-08-02 DIAGNOSIS — R300 Dysuria: Secondary | ICD-10-CM | POA: Diagnosis not present

## 2020-08-04 DIAGNOSIS — R300 Dysuria: Secondary | ICD-10-CM | POA: Diagnosis not present

## 2020-08-08 DIAGNOSIS — H66001 Acute suppurative otitis media without spontaneous rupture of ear drum, right ear: Secondary | ICD-10-CM | POA: Diagnosis not present

## 2020-08-13 DIAGNOSIS — R531 Weakness: Secondary | ICD-10-CM | POA: Diagnosis not present

## 2020-08-13 DIAGNOSIS — R42 Dizziness and giddiness: Secondary | ICD-10-CM | POA: Diagnosis not present

## 2020-08-13 DIAGNOSIS — Z888 Allergy status to other drugs, medicaments and biological substances status: Secondary | ICD-10-CM | POA: Diagnosis not present

## 2020-08-13 DIAGNOSIS — R002 Palpitations: Secondary | ICD-10-CM | POA: Diagnosis not present

## 2020-08-13 DIAGNOSIS — I499 Cardiac arrhythmia, unspecified: Secondary | ICD-10-CM | POA: Diagnosis not present

## 2020-08-13 DIAGNOSIS — F419 Anxiety disorder, unspecified: Secondary | ICD-10-CM | POA: Diagnosis not present

## 2020-08-13 DIAGNOSIS — R0602 Shortness of breath: Secondary | ICD-10-CM | POA: Diagnosis not present

## 2020-08-13 DIAGNOSIS — R55 Syncope and collapse: Secondary | ICD-10-CM | POA: Diagnosis not present

## 2020-08-17 ENCOUNTER — Encounter: Payer: Self-pay | Admitting: Family Medicine

## 2020-08-17 ENCOUNTER — Ambulatory Visit (INDEPENDENT_AMBULATORY_CARE_PROVIDER_SITE_OTHER): Payer: 59

## 2020-08-17 ENCOUNTER — Other Ambulatory Visit: Payer: Self-pay

## 2020-08-17 ENCOUNTER — Ambulatory Visit (INDEPENDENT_AMBULATORY_CARE_PROVIDER_SITE_OTHER): Payer: 59 | Admitting: Family Medicine

## 2020-08-17 VITALS — BP 139/79 | HR 72 | Temp 98.7°F | Resp 16

## 2020-08-17 DIAGNOSIS — E611 Iron deficiency: Secondary | ICD-10-CM | POA: Diagnosis not present

## 2020-08-17 DIAGNOSIS — K3189 Other diseases of stomach and duodenum: Secondary | ICD-10-CM | POA: Diagnosis not present

## 2020-08-17 DIAGNOSIS — R1030 Lower abdominal pain, unspecified: Secondary | ICD-10-CM | POA: Diagnosis not present

## 2020-08-17 DIAGNOSIS — R002 Palpitations: Secondary | ICD-10-CM | POA: Diagnosis not present

## 2020-08-17 DIAGNOSIS — N281 Cyst of kidney, acquired: Secondary | ICD-10-CM | POA: Diagnosis not present

## 2020-08-17 DIAGNOSIS — R1110 Vomiting, unspecified: Secondary | ICD-10-CM | POA: Diagnosis not present

## 2020-08-17 DIAGNOSIS — Z743 Need for continuous supervision: Secondary | ICD-10-CM | POA: Diagnosis not present

## 2020-08-17 DIAGNOSIS — R509 Fever, unspecified: Secondary | ICD-10-CM | POA: Diagnosis not present

## 2020-08-17 DIAGNOSIS — R110 Nausea: Secondary | ICD-10-CM | POA: Diagnosis not present

## 2020-08-17 DIAGNOSIS — R0602 Shortness of breath: Secondary | ICD-10-CM | POA: Diagnosis not present

## 2020-08-17 DIAGNOSIS — Z20822 Contact with and (suspected) exposure to covid-19: Secondary | ICD-10-CM | POA: Diagnosis not present

## 2020-08-17 DIAGNOSIS — R079 Chest pain, unspecified: Secondary | ICD-10-CM | POA: Diagnosis not present

## 2020-08-17 DIAGNOSIS — R55 Syncope and collapse: Secondary | ICD-10-CM | POA: Diagnosis not present

## 2020-08-17 DIAGNOSIS — R6889 Other general symptoms and signs: Secondary | ICD-10-CM | POA: Diagnosis not present

## 2020-08-17 DIAGNOSIS — R197 Diarrhea, unspecified: Secondary | ICD-10-CM | POA: Diagnosis not present

## 2020-08-17 DIAGNOSIS — Z888 Allergy status to other drugs, medicaments and biological substances status: Secondary | ICD-10-CM | POA: Diagnosis not present

## 2020-08-17 DIAGNOSIS — H9201 Otalgia, right ear: Secondary | ICD-10-CM | POA: Diagnosis not present

## 2020-08-17 DIAGNOSIS — N3289 Other specified disorders of bladder: Secondary | ICD-10-CM | POA: Diagnosis not present

## 2020-08-17 DIAGNOSIS — R519 Headache, unspecified: Secondary | ICD-10-CM | POA: Diagnosis not present

## 2020-08-17 DIAGNOSIS — K219 Gastro-esophageal reflux disease without esophagitis: Secondary | ICD-10-CM | POA: Diagnosis not present

## 2020-08-17 DIAGNOSIS — R109 Unspecified abdominal pain: Secondary | ICD-10-CM | POA: Diagnosis not present

## 2020-08-17 NOTE — Progress Notes (Signed)
Acute Office Visit  Subjective:    Patient ID: Emily Russell, female    DOB: July 06, 1985, 35 y.o.   MRN: [id number]  Chief Complaint  Patient presents with  . Palpitations    HPI Patient is in today for palpitations.  Patient with history of palpitations.  She was seen by PCP last November and restarted on propranolol daily.  She reports that she had been doing fine the past several months however had a major episode on Thursday.  She was sitting in the drive-through line and her heart began racing out of control and she fell like she was going to pass out so she called EMS.  When they arrived they told her that her pulse felt irregular but by the time they hooked her up for an EKG it was normal.  She did end up going to the hospital.  Blood work was unremarkable however D-dimer was slightly elevated. EKG, chest x-ray, CTA chest normal apart from some mild atelectasis.  She describes the feeling as a weird feeling that her heart is up in her throat racing in a way that did not feel like previous PVCs.  The episode on Thursday did pause some shortness of breath and nausea, but she has not had any more that severe.  She continues to have daily episodes lasting hours for the past several days.  Occurrence is random both with rest and activity.  She denies any chest pain, but states she does get a weird pulling sensation when the episodes are happening.  She does feel like she is going to pass out when this happens.  She has even had episodes that have awakened her from sleeping.  She has not found anything to make them better.  The propranolol does not seem to be making a difference now.  She denies excessive caffeine intake or any new stressors or anxiety triggers.  She denies any diaphoresis, fatigue, weight changes, chest pressure, fevers, malaise, recent illnesss.    Past Medical History:  Diagnosis Date  . Anxiety   . Bumps on skin 01/2019   face near right eye.  . Depression     Past  Surgical History:  Procedure Laterality Date  . IR FIBRIN GLUE REPAIR ANAL FISTULA  2020    Family History  Problem Relation Age of Onset  . Hypertension Mother   . Lupus Father   . Stroke Paternal Grandmother   . Diabetes Paternal Grandfather     Social History   Socioeconomic History  . Marital status: Married    Spouse name: Not on file  . Number of children: Not on file  . Years of education: Not on file  . Highest education level: Not on file  Occupational History  . Not on file  Tobacco Use  . Smoking status: Never Smoker  . Smokeless tobacco: Never Used  Substance and Sexual Activity  . Alcohol use: Not Currently  . Drug use: Never  . Sexual activity: Yes    Partners: Male    Birth control/protection: Condom  Other Topics Concern  . Not on file  Social History Narrative  . Not on file   Social Determinants of Health   Financial Resource Strain: Not on file  Food Insecurity: Not on file  Transportation Needs: Not on file  Physical Activity: Not on file  Stress: Not on file  Social Connections: Not on file  Intimate Partner Violence: Not on file    Outpatient Medications Prior to Visit  Medication  Sig Dispense Refill  . Ascorbic Acid (VITAMIN C) 1000 MG tablet Take 1,100 mg by mouth daily.    . Cholecalciferol 125 MCG (5000 UT) capsule Take 5,000 Units by mouth daily.    . cyanocobalamin (,VITAMIN B-12,) 1000 MCG/ML injection Inject 1 mL (1,000 mcg total) into the muscle every 30 (thirty) days. 1 mL 12  . dicyclomine (BENTYL) 10 MG capsule Take 1 capsule (10 mg total) by mouth 3 (three) times daily as needed for spasms. 60 capsule 0  . meloxicam (MOBIC) 15 MG tablet One tab PO qAM with a meal for 2 weeks, then daily prn pain. 30 tablet 3  . Menaquinone-7 (VITAMIN K2) 100 MCG CAPS Take 1 capsule by mouth daily.    . Omega-3 Fatty Acids (FISH OIL) 1000 MG CAPS Take 1 capsule by mouth daily.    . propranolol ER (INDERAL LA) 60 MG 24 hr capsule Take 1 capsule  (60 mg total) by mouth daily. 90 capsule 1  . triazolam (HALCION) 0.25 MG tablet 1-2 tabs PO 2 hours before procedure or imaging.  Do not drive with this medication. 2 tablet 0  . Turmeric (QC TUMERIC COMPLEX PO) Take 1 tablet by mouth daily.     No facility-administered medications prior to visit.    No Known Allergies  Review of Systems All review of systems negative except what is listed in the HPI     Objective:    Physical Exam Vitals reviewed.  Constitutional:      Appearance: Normal appearance. She is normal weight.  HENT:     Head: Normocephalic and atraumatic.  Cardiovascular:     Rate and Rhythm: Normal rate and regular rhythm.     Pulses: Normal pulses.     Heart sounds: Normal heart sounds.  Pulmonary:     Effort: Pulmonary effort is normal.     Breath sounds: Normal breath sounds.  Musculoskeletal:     Cervical back: Normal range of motion and neck supple.  Skin:    General: Skin is warm and dry.     Capillary Refill: Capillary refill takes less than 2 seconds.  Neurological:     General: No focal deficit present.     Mental Status: She is alert and oriented to person, place, and time.  Psychiatric:        Mood and Affect: Mood normal.        Behavior: Behavior normal.        Thought Content: Thought content normal.        Judgment: Judgment normal.     BP 139/79   Pulse 72   Temp 98.7 F (37.1 C)   Resp 16   SpO2 100%  Wt Readings from Last 3 Encounters:  05/19/20 159 lb (72.1 kg)  02/11/20 164 lb 4.8 oz (74.5 kg)  09/11/19 164 lb (74.4 kg)    Health Maintenance Due  Topic Date Due  . Hepatitis C Screening  Never done  . PAP SMEAR-Modifier  03/28/2020    There are no preventive care reminders to display for this patient.   Lab Results  Component Value Date   TSH 0.67 05/19/2020   Lab Results  Component Value Date   WBC 7.6 05/19/2020   HGB 12.6 05/19/2020   HCT 37.4 05/19/2020   MCV 82.7 05/19/2020   PLT 266 05/19/2020   Lab  Results  Component Value Date   NA 140 02/11/2020   K 4.4 02/11/2020   CO2 27 02/11/2020   GLUCOSE  96 02/11/2020   BUN 9 02/11/2020   CREATININE 0.78 02/11/2020   BILITOT 0.4 02/11/2020   AST 10 02/11/2020   ALT 8 02/11/2020   PROT 6.4 02/11/2020   CALCIUM 9.3 02/11/2020   No results found for: CHOL No results found for: HDL No results found for: LDLCALC No results found for: TRIG No results found for: CHOLHDL No results found for: HGBA1C     Assessment & Plan:   Problem List Items Addressed This Visit      Other   Palpitations - Primary    EKG NSR today.  Will recheck TSH, other labs done in ED unremarkable. Checking Ferritin - pt reports history of IDA Ordering long-term heart monitor. Follow-up pending results Educated on signs and symptoms requiring urgent evaluation. Consider further workup after heart monitor/lab results.      Relevant Orders   LONG TERM MONITOR (3-14 DAYS)   TSH   EKG 12-Lead   Ferritin       No orders of the defined types were placed in this encounter.  Follow-up pending results or as needed.   Terrilyn Saver, NP

## 2020-08-17 NOTE — Patient Instructions (Signed)

## 2020-08-17 NOTE — Assessment & Plan Note (Addendum)
EKG NSR today.  Will recheck TSH, other labs done in ED unremarkable. Checking Ferritin - pt reports history of IDA Ordering long-term heart monitor. Follow-up pending results Educated on signs and symptoms requiring urgent evaluation. Consider further workup after heart monitor/lab results.

## 2020-08-18 DIAGNOSIS — R1030 Lower abdominal pain, unspecified: Secondary | ICD-10-CM | POA: Diagnosis not present

## 2020-08-18 DIAGNOSIS — N3289 Other specified disorders of bladder: Secondary | ICD-10-CM | POA: Diagnosis not present

## 2020-08-18 DIAGNOSIS — R509 Fever, unspecified: Secondary | ICD-10-CM | POA: Diagnosis not present

## 2020-08-18 DIAGNOSIS — R002 Palpitations: Secondary | ICD-10-CM | POA: Diagnosis not present

## 2020-08-18 DIAGNOSIS — H9201 Otalgia, right ear: Secondary | ICD-10-CM | POA: Diagnosis not present

## 2020-08-18 DIAGNOSIS — N281 Cyst of kidney, acquired: Secondary | ICD-10-CM | POA: Diagnosis not present

## 2020-08-18 DIAGNOSIS — R109 Unspecified abdominal pain: Secondary | ICD-10-CM | POA: Diagnosis not present

## 2020-08-18 DIAGNOSIS — R0602 Shortness of breath: Secondary | ICD-10-CM | POA: Diagnosis not present

## 2020-08-18 DIAGNOSIS — K3189 Other diseases of stomach and duodenum: Secondary | ICD-10-CM | POA: Diagnosis not present

## 2020-08-18 DIAGNOSIS — Z20822 Contact with and (suspected) exposure to covid-19: Secondary | ICD-10-CM | POA: Diagnosis not present

## 2020-08-18 DIAGNOSIS — R519 Headache, unspecified: Secondary | ICD-10-CM | POA: Diagnosis not present

## 2020-08-18 DIAGNOSIS — R55 Syncope and collapse: Secondary | ICD-10-CM | POA: Diagnosis not present

## 2020-08-18 DIAGNOSIS — Z888 Allergy status to other drugs, medicaments and biological substances status: Secondary | ICD-10-CM | POA: Diagnosis not present

## 2020-08-18 DIAGNOSIS — K219 Gastro-esophageal reflux disease without esophagitis: Secondary | ICD-10-CM | POA: Diagnosis not present

## 2020-08-18 LAB — FERRITIN: Ferritin: 6 ng/mL — ABNORMAL LOW (ref 16–154)

## 2020-08-18 LAB — TSH: TSH: 1.27 mIU/L

## 2020-08-18 NOTE — Progress Notes (Signed)
MyChart message sent - Your iron did come back quite low. I know in the past you didn't respond much to oral iron and needed the infusion, so I'm going to go ahead and put in for a hematology referral. Someone should be calling you about this. You can take an iron supplement, but if this not help in the past, then you may need another infusion.

## 2020-08-18 NOTE — Addendum Note (Signed)
Addended by: Caleen Jobs B on: 08/18/2020 08:35 AM   Modules accepted: Orders

## 2020-08-19 ENCOUNTER — Ambulatory Visit: Payer: 59

## 2020-08-19 ENCOUNTER — Telehealth: Payer: Self-pay | Admitting: *Deleted

## 2020-08-19 DIAGNOSIS — R002 Palpitations: Secondary | ICD-10-CM

## 2020-08-19 NOTE — Telephone Encounter (Signed)
Long term monitor reordered.

## 2020-08-20 ENCOUNTER — Telehealth: Payer: Self-pay | Admitting: *Deleted

## 2020-08-20 DIAGNOSIS — R198 Other specified symptoms and signs involving the digestive system and abdomen: Secondary | ICD-10-CM | POA: Diagnosis not present

## 2020-08-20 DIAGNOSIS — R5383 Other fatigue: Secondary | ICD-10-CM | POA: Diagnosis not present

## 2020-08-20 DIAGNOSIS — M3500 Sicca syndrome, unspecified: Secondary | ICD-10-CM | POA: Diagnosis not present

## 2020-08-20 DIAGNOSIS — D510 Vitamin B12 deficiency anemia due to intrinsic factor deficiency: Secondary | ICD-10-CM | POA: Diagnosis not present

## 2020-08-20 DIAGNOSIS — L439 Lichen planus, unspecified: Secondary | ICD-10-CM | POA: Diagnosis not present

## 2020-08-20 NOTE — Telephone Encounter (Signed)
Per 08/18/20 referral Dr. Olevia Bowens - callend and lvm of upcoming appointments - mailed calendar with welcome packet

## 2020-08-21 DIAGNOSIS — R002 Palpitations: Secondary | ICD-10-CM | POA: Diagnosis not present

## 2020-09-03 DIAGNOSIS — R002 Palpitations: Secondary | ICD-10-CM | POA: Diagnosis not present

## 2020-09-04 ENCOUNTER — Encounter: Payer: Self-pay | Admitting: Family Medicine

## 2020-09-04 NOTE — Progress Notes (Signed)
A few early beats, but this is a benign finding - overall normal monitoring! Glad to hear you are feeling better!

## 2020-09-09 ENCOUNTER — Telehealth: Payer: Self-pay | Admitting: *Deleted

## 2020-09-09 NOTE — Telephone Encounter (Signed)
Patient called to cancel her appointment. She said she is going to a facility closer to home. Referral closed.

## 2020-09-14 DIAGNOSIS — D510 Vitamin B12 deficiency anemia due to intrinsic factor deficiency: Secondary | ICD-10-CM | POA: Diagnosis not present

## 2020-09-14 DIAGNOSIS — R790 Abnormal level of blood mineral: Secondary | ICD-10-CM | POA: Diagnosis not present

## 2020-09-14 DIAGNOSIS — E538 Deficiency of other specified B group vitamins: Secondary | ICD-10-CM | POA: Diagnosis not present

## 2020-09-14 DIAGNOSIS — M3500 Sicca syndrome, unspecified: Secondary | ICD-10-CM | POA: Diagnosis not present

## 2020-09-14 DIAGNOSIS — E559 Vitamin D deficiency, unspecified: Secondary | ICD-10-CM | POA: Diagnosis not present

## 2020-09-14 DIAGNOSIS — R198 Other specified symptoms and signs involving the digestive system and abdomen: Secondary | ICD-10-CM | POA: Diagnosis not present

## 2020-09-14 DIAGNOSIS — R5383 Other fatigue: Secondary | ICD-10-CM | POA: Diagnosis not present

## 2020-09-14 DIAGNOSIS — E039 Hypothyroidism, unspecified: Secondary | ICD-10-CM | POA: Diagnosis not present

## 2020-09-14 DIAGNOSIS — E612 Magnesium deficiency: Secondary | ICD-10-CM | POA: Diagnosis not present

## 2020-09-16 ENCOUNTER — Ambulatory Visit: Payer: 59 | Admitting: Family

## 2020-09-16 ENCOUNTER — Other Ambulatory Visit: Payer: 59

## 2020-09-24 DIAGNOSIS — H6981 Other specified disorders of Eustachian tube, right ear: Secondary | ICD-10-CM | POA: Diagnosis not present

## 2020-09-24 DIAGNOSIS — M940 Chondrocostal junction syndrome [Tietze]: Secondary | ICD-10-CM | POA: Diagnosis not present

## 2020-10-19 DIAGNOSIS — D2371 Other benign neoplasm of skin of right lower limb, including hip: Secondary | ICD-10-CM | POA: Diagnosis not present

## 2020-10-19 DIAGNOSIS — D2360 Other benign neoplasm of skin of unspecified upper limb, including shoulder: Secondary | ICD-10-CM | POA: Diagnosis not present

## 2020-10-28 DIAGNOSIS — R5383 Other fatigue: Secondary | ICD-10-CM | POA: Diagnosis not present

## 2020-10-28 DIAGNOSIS — R790 Abnormal level of blood mineral: Secondary | ICD-10-CM | POA: Diagnosis not present

## 2020-10-28 DIAGNOSIS — D510 Vitamin B12 deficiency anemia due to intrinsic factor deficiency: Secondary | ICD-10-CM | POA: Diagnosis not present

## 2020-10-28 DIAGNOSIS — Z77120 Contact with and (suspected) exposure to mold (toxic): Secondary | ICD-10-CM | POA: Diagnosis not present

## 2020-10-28 DIAGNOSIS — L439 Lichen planus, unspecified: Secondary | ICD-10-CM | POA: Diagnosis not present

## 2020-10-28 DIAGNOSIS — R198 Other specified symptoms and signs involving the digestive system and abdomen: Secondary | ICD-10-CM | POA: Diagnosis not present

## 2020-10-28 DIAGNOSIS — M3500 Sicca syndrome, unspecified: Secondary | ICD-10-CM | POA: Diagnosis not present

## 2020-11-16 DIAGNOSIS — D519 Vitamin B12 deficiency anemia, unspecified: Secondary | ICD-10-CM | POA: Diagnosis not present

## 2020-11-16 DIAGNOSIS — E039 Hypothyroidism, unspecified: Secondary | ICD-10-CM | POA: Diagnosis not present

## 2020-11-16 DIAGNOSIS — E559 Vitamin D deficiency, unspecified: Secondary | ICD-10-CM | POA: Diagnosis not present

## 2020-11-17 DIAGNOSIS — M25511 Pain in right shoulder: Secondary | ICD-10-CM | POA: Diagnosis not present

## 2020-11-17 DIAGNOSIS — G8929 Other chronic pain: Secondary | ICD-10-CM | POA: Diagnosis not present

## 2021-01-13 DIAGNOSIS — R5383 Other fatigue: Secondary | ICD-10-CM | POA: Diagnosis not present

## 2021-01-13 DIAGNOSIS — E538 Deficiency of other specified B group vitamins: Secondary | ICD-10-CM | POA: Diagnosis not present

## 2021-01-13 DIAGNOSIS — D510 Vitamin B12 deficiency anemia due to intrinsic factor deficiency: Secondary | ICD-10-CM | POA: Diagnosis not present

## 2021-01-13 DIAGNOSIS — E612 Magnesium deficiency: Secondary | ICD-10-CM | POA: Diagnosis not present

## 2021-01-13 DIAGNOSIS — N926 Irregular menstruation, unspecified: Secondary | ICD-10-CM | POA: Diagnosis not present

## 2021-01-13 DIAGNOSIS — E039 Hypothyroidism, unspecified: Secondary | ICD-10-CM | POA: Diagnosis not present

## 2021-01-13 DIAGNOSIS — E559 Vitamin D deficiency, unspecified: Secondary | ICD-10-CM | POA: Diagnosis not present

## 2021-01-13 DIAGNOSIS — R790 Abnormal level of blood mineral: Secondary | ICD-10-CM | POA: Diagnosis not present

## 2021-01-13 DIAGNOSIS — M3500 Sicca syndrome, unspecified: Secondary | ICD-10-CM | POA: Diagnosis not present

## 2021-03-15 DIAGNOSIS — E559 Vitamin D deficiency, unspecified: Secondary | ICD-10-CM | POA: Diagnosis not present

## 2021-03-15 DIAGNOSIS — D519 Vitamin B12 deficiency anemia, unspecified: Secondary | ICD-10-CM | POA: Diagnosis not present

## 2021-03-15 DIAGNOSIS — E639 Nutritional deficiency, unspecified: Secondary | ICD-10-CM | POA: Diagnosis not present

## 2021-04-01 DIAGNOSIS — R7989 Other specified abnormal findings of blood chemistry: Secondary | ICD-10-CM | POA: Diagnosis not present

## 2021-04-01 DIAGNOSIS — D510 Vitamin B12 deficiency anemia due to intrinsic factor deficiency: Secondary | ICD-10-CM | POA: Diagnosis not present

## 2021-04-01 DIAGNOSIS — M3500 Sicca syndrome, unspecified: Secondary | ICD-10-CM | POA: Diagnosis not present

## 2021-04-01 DIAGNOSIS — Z1329 Encounter for screening for other suspected endocrine disorder: Secondary | ICD-10-CM | POA: Diagnosis not present

## 2021-04-01 DIAGNOSIS — R790 Abnormal level of blood mineral: Secondary | ICD-10-CM | POA: Diagnosis not present

## 2021-04-01 DIAGNOSIS — E538 Deficiency of other specified B group vitamins: Secondary | ICD-10-CM | POA: Diagnosis not present

## 2021-04-01 DIAGNOSIS — R5383 Other fatigue: Secondary | ICD-10-CM | POA: Diagnosis not present

## 2021-04-01 DIAGNOSIS — L439 Lichen planus, unspecified: Secondary | ICD-10-CM | POA: Diagnosis not present

## 2021-04-01 DIAGNOSIS — R198 Other specified symptoms and signs involving the digestive system and abdomen: Secondary | ICD-10-CM | POA: Diagnosis not present

## 2021-04-16 DIAGNOSIS — R5383 Other fatigue: Secondary | ICD-10-CM | POA: Diagnosis not present

## 2021-04-16 DIAGNOSIS — R197 Diarrhea, unspecified: Secondary | ICD-10-CM | POA: Diagnosis not present

## 2021-04-16 DIAGNOSIS — L439 Lichen planus, unspecified: Secondary | ICD-10-CM | POA: Diagnosis not present

## 2021-04-16 DIAGNOSIS — E039 Hypothyroidism, unspecified: Secondary | ICD-10-CM | POA: Diagnosis not present

## 2021-04-16 DIAGNOSIS — M3500 Sicca syndrome, unspecified: Secondary | ICD-10-CM | POA: Diagnosis not present

## 2021-04-16 DIAGNOSIS — D510 Vitamin B12 deficiency anemia due to intrinsic factor deficiency: Secondary | ICD-10-CM | POA: Diagnosis not present

## 2021-04-16 DIAGNOSIS — E559 Vitamin D deficiency, unspecified: Secondary | ICD-10-CM | POA: Diagnosis not present

## 2021-04-29 DIAGNOSIS — R55 Syncope and collapse: Secondary | ICD-10-CM | POA: Diagnosis not present

## 2021-06-04 DIAGNOSIS — R102 Pelvic and perineal pain: Secondary | ICD-10-CM | POA: Diagnosis not present

## 2021-06-04 DIAGNOSIS — D519 Vitamin B12 deficiency anemia, unspecified: Secondary | ICD-10-CM | POA: Diagnosis not present

## 2021-06-04 DIAGNOSIS — E039 Hypothyroidism, unspecified: Secondary | ICD-10-CM | POA: Diagnosis not present

## 2021-06-04 DIAGNOSIS — E559 Vitamin D deficiency, unspecified: Secondary | ICD-10-CM | POA: Diagnosis not present

## 2021-06-11 DIAGNOSIS — M9904 Segmental and somatic dysfunction of sacral region: Secondary | ICD-10-CM | POA: Diagnosis not present

## 2021-06-11 DIAGNOSIS — M9902 Segmental and somatic dysfunction of thoracic region: Secondary | ICD-10-CM | POA: Diagnosis not present

## 2021-06-11 DIAGNOSIS — M9903 Segmental and somatic dysfunction of lumbar region: Secondary | ICD-10-CM | POA: Diagnosis not present

## 2021-06-11 DIAGNOSIS — M531 Cervicobrachial syndrome: Secondary | ICD-10-CM | POA: Diagnosis not present

## 2021-06-11 DIAGNOSIS — M9901 Segmental and somatic dysfunction of cervical region: Secondary | ICD-10-CM | POA: Diagnosis not present

## 2021-08-02 DIAGNOSIS — Z713 Dietary counseling and surveillance: Secondary | ICD-10-CM | POA: Diagnosis not present

## 2021-08-02 DIAGNOSIS — R0602 Shortness of breath: Secondary | ICD-10-CM | POA: Diagnosis not present

## 2021-08-02 DIAGNOSIS — Z79899 Other long term (current) drug therapy: Secondary | ICD-10-CM | POA: Diagnosis not present

## 2021-08-02 DIAGNOSIS — Z7902 Long term (current) use of antithrombotics/antiplatelets: Secondary | ICD-10-CM | POA: Diagnosis not present

## 2021-08-02 DIAGNOSIS — R011 Cardiac murmur, unspecified: Secondary | ICD-10-CM | POA: Diagnosis not present

## 2021-08-02 DIAGNOSIS — I080 Rheumatic disorders of both mitral and aortic valves: Secondary | ICD-10-CM | POA: Diagnosis not present

## 2021-08-06 DIAGNOSIS — K5900 Constipation, unspecified: Secondary | ICD-10-CM | POA: Diagnosis not present

## 2021-08-06 DIAGNOSIS — R1010 Upper abdominal pain, unspecified: Secondary | ICD-10-CM | POA: Diagnosis not present

## 2021-08-12 DIAGNOSIS — R350 Frequency of micturition: Secondary | ICD-10-CM | POA: Diagnosis not present

## 2021-08-12 DIAGNOSIS — R300 Dysuria: Secondary | ICD-10-CM | POA: Diagnosis not present

## 2021-08-27 DIAGNOSIS — E559 Vitamin D deficiency, unspecified: Secondary | ICD-10-CM | POA: Diagnosis not present

## 2021-08-27 DIAGNOSIS — D519 Vitamin B12 deficiency anemia, unspecified: Secondary | ICD-10-CM | POA: Diagnosis not present

## 2021-08-27 DIAGNOSIS — E639 Nutritional deficiency, unspecified: Secondary | ICD-10-CM | POA: Diagnosis not present

## 2021-08-27 DIAGNOSIS — R5383 Other fatigue: Secondary | ICD-10-CM | POA: Diagnosis not present

## 2021-10-22 DIAGNOSIS — B3731 Acute candidiasis of vulva and vagina: Secondary | ICD-10-CM | POA: Diagnosis not present

## 2021-10-22 DIAGNOSIS — N6321 Unspecified lump in the left breast, upper outer quadrant: Secondary | ICD-10-CM | POA: Diagnosis not present

## 2021-10-22 DIAGNOSIS — Z01419 Encounter for gynecological examination (general) (routine) without abnormal findings: Secondary | ICD-10-CM | POA: Diagnosis not present

## 2022-02-02 DIAGNOSIS — D649 Anemia, unspecified: Secondary | ICD-10-CM | POA: Diagnosis not present

## 2022-02-02 DIAGNOSIS — E559 Vitamin D deficiency, unspecified: Secondary | ICD-10-CM | POA: Diagnosis not present

## 2022-02-02 DIAGNOSIS — R5383 Other fatigue: Secondary | ICD-10-CM | POA: Diagnosis not present

## 2022-02-02 DIAGNOSIS — G619 Inflammatory polyneuropathy, unspecified: Secondary | ICD-10-CM | POA: Diagnosis not present

## 2022-08-23 IMAGING — DX DG SHOULDER 2+V*R*
3 series · 3 of 3 positions shown · non-contrast
Comparison: None.

CLINICAL DATA: Shoulder pain

EXAM:
RIGHT SHOULDER - 2+ VIEW

[shoulder y view]
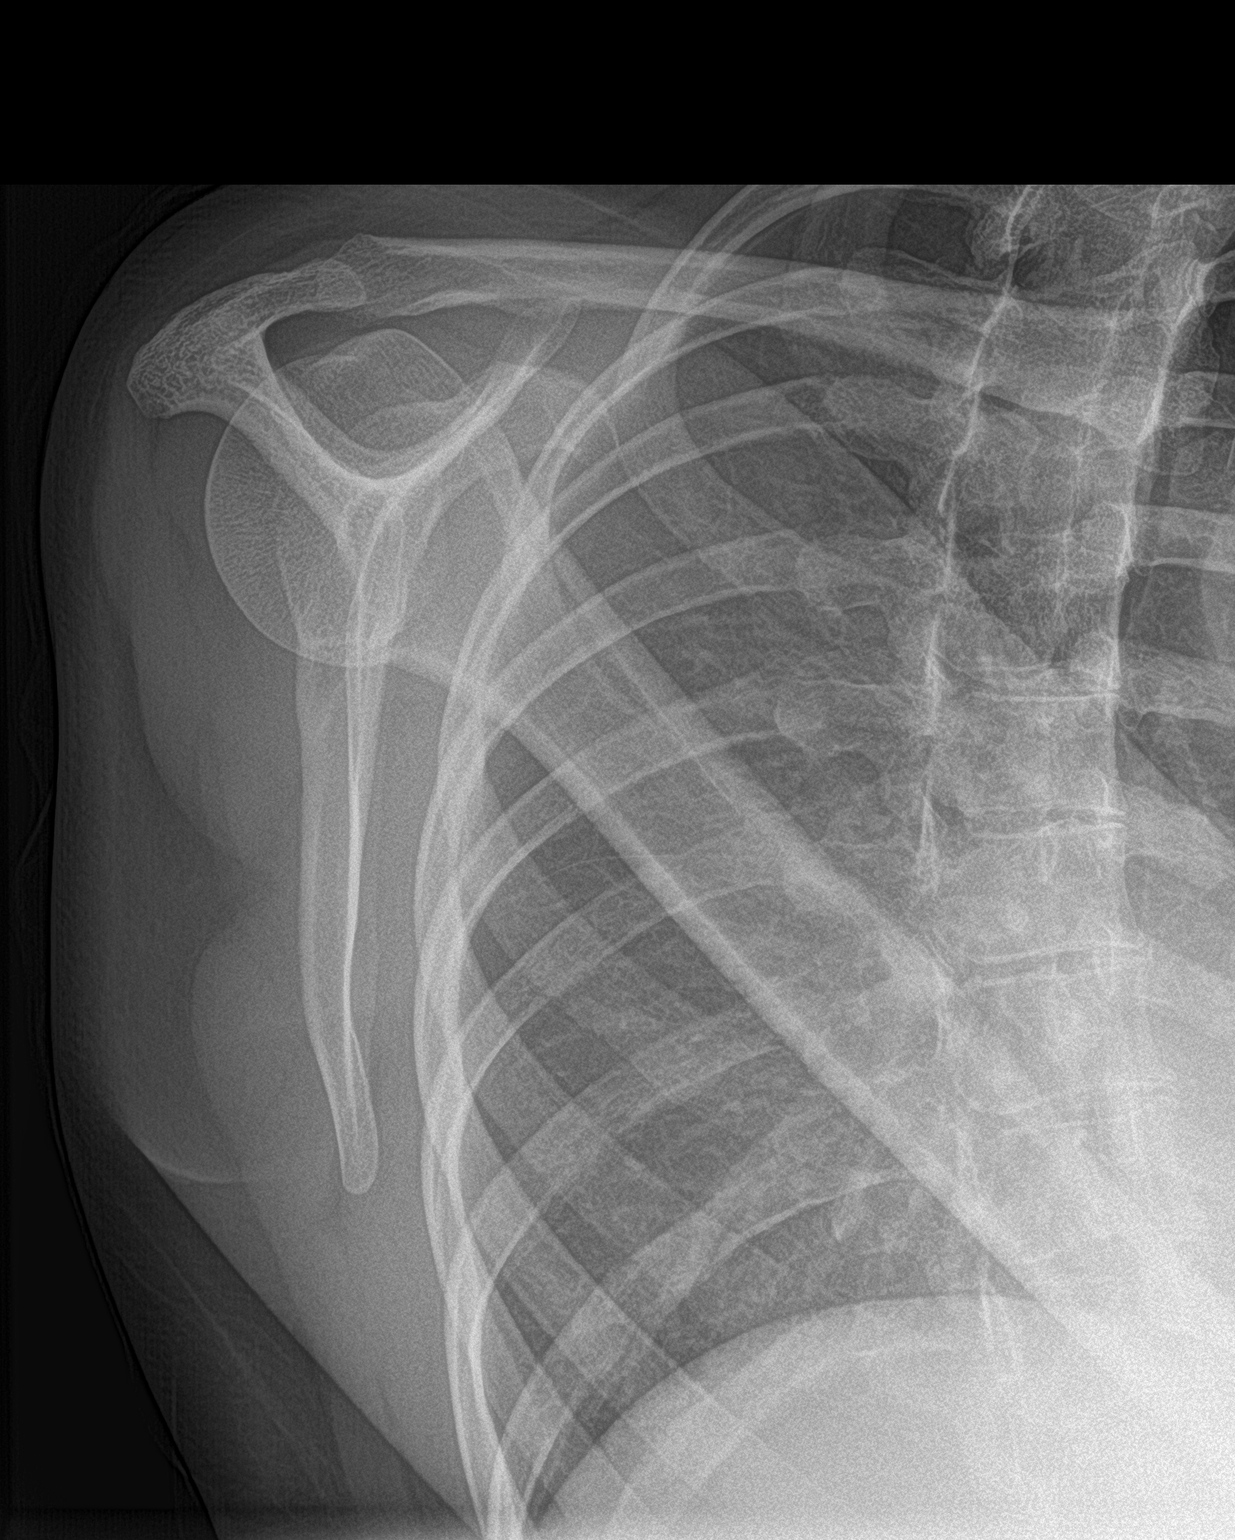

[shoulder axillary]
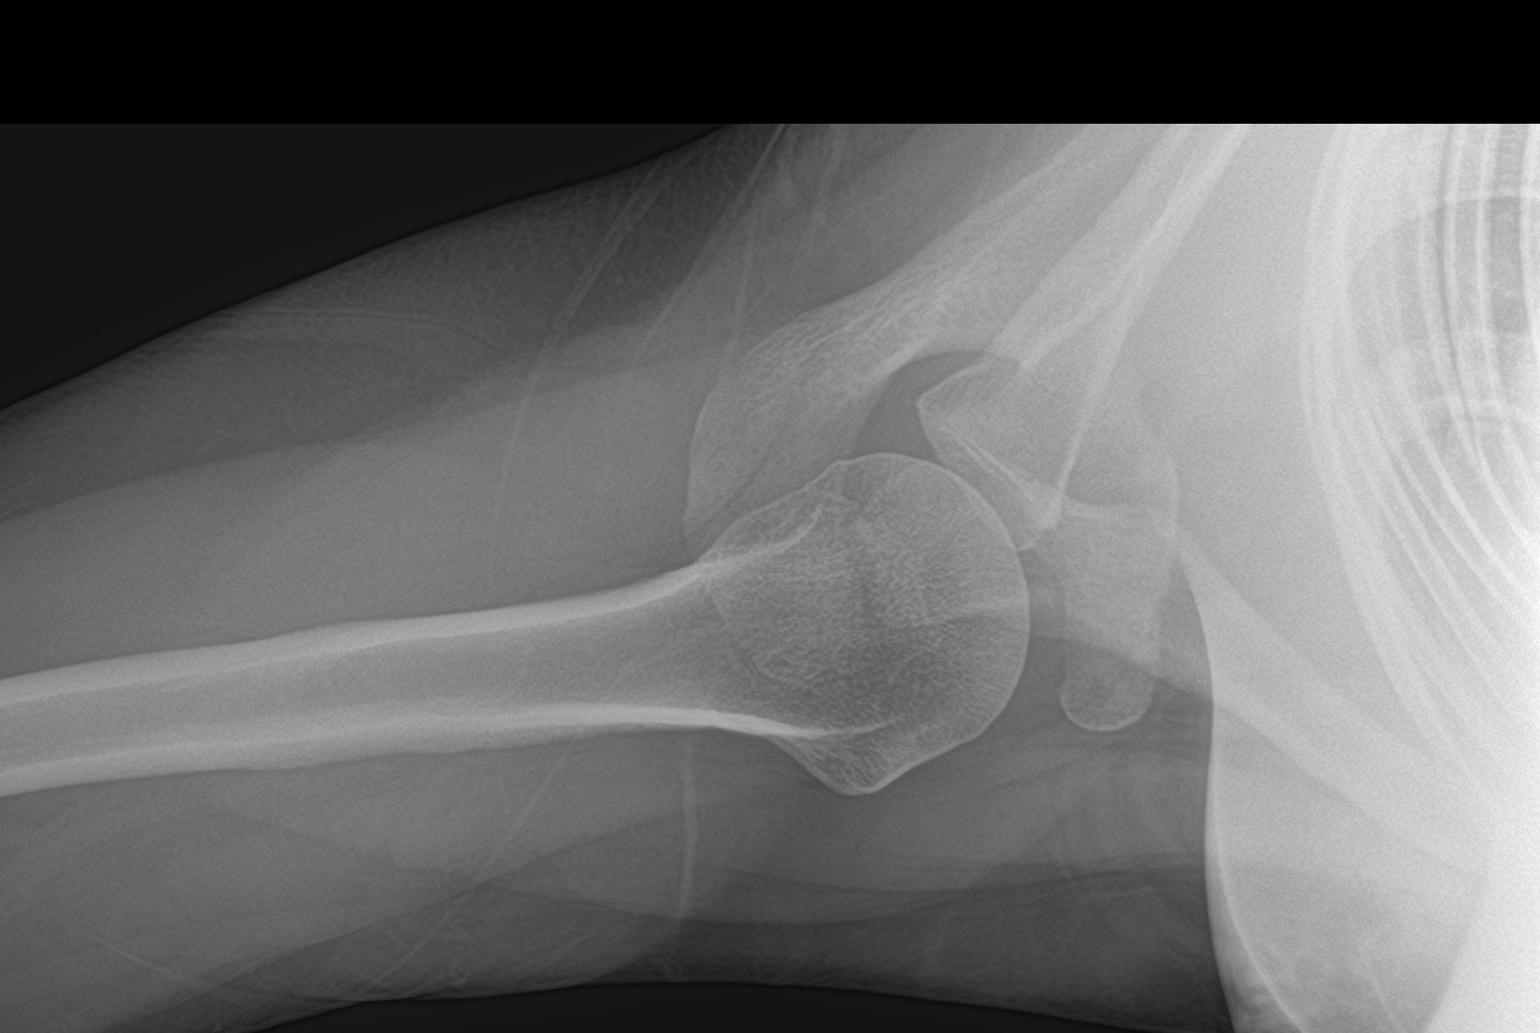

[shoulder grashey]
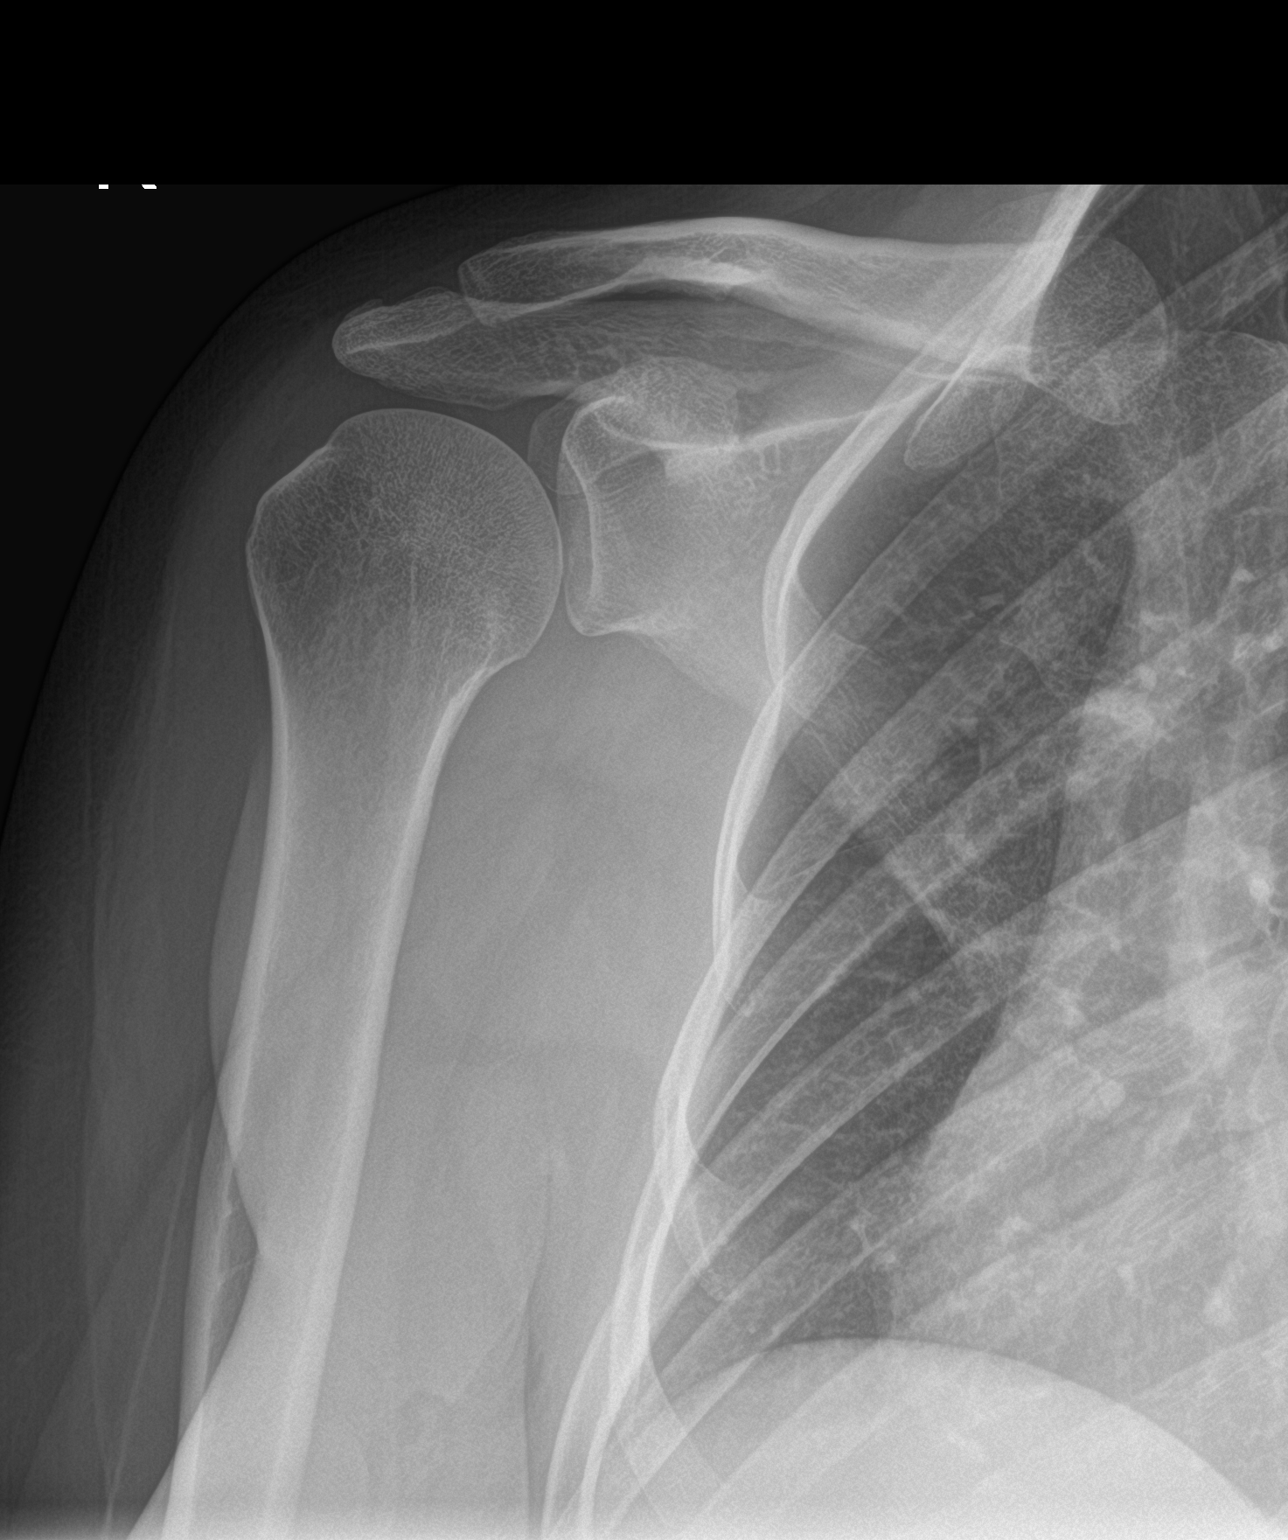

[3 of 3 positions shown; findings below may reference images not displayed]

FINDINGS: There is no evidence of fracture or dislocation. There is no
evidence of arthropathy or other focal bone abnormality. Soft
tissues are unremarkable.
IMPRESSION: Negative.

## 2022-08-23 IMAGING — DX DG CERVICAL SPINE COMPLETE 4+V
6 series · 6 of 6 positions shown · non-contrast
Comparison: None.

CLINICAL DATA: Right-sided shoulder pain

EXAM:
CERVICAL SPINE - COMPLETE 4+ VIEW

[c-spine lat]
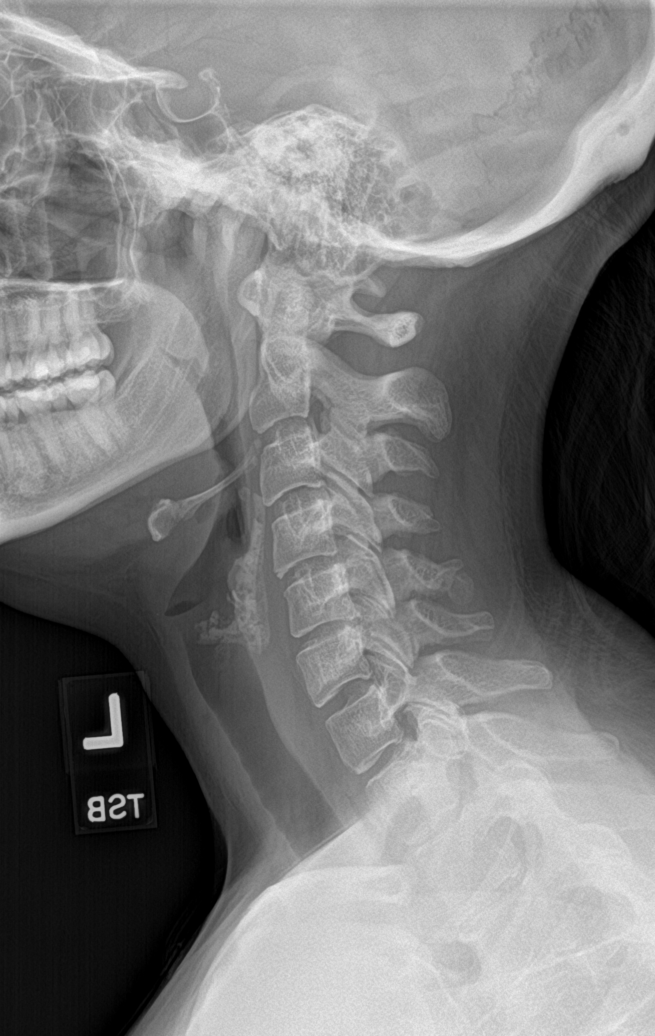

[c-spine obl (1 of 2)]
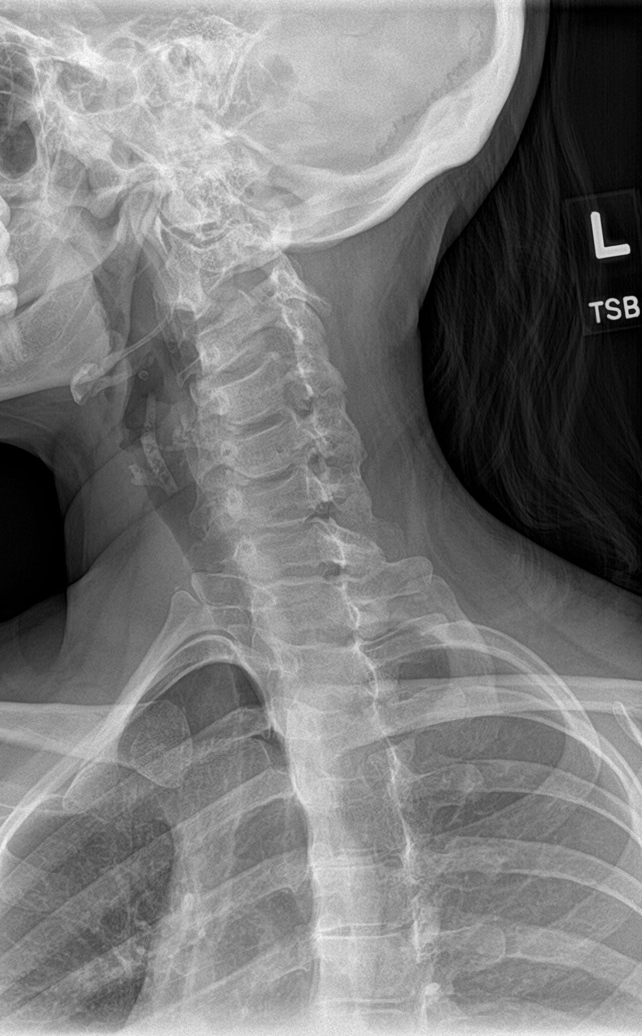

[c-spine ap]
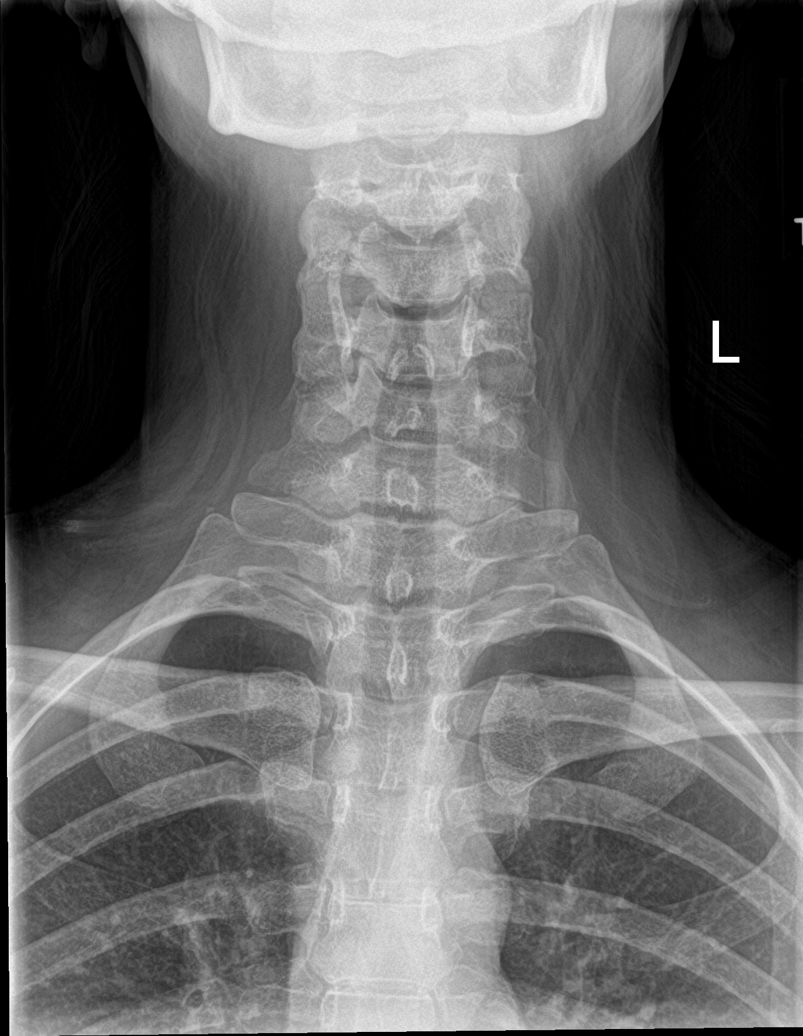

[c-spine open mouth]
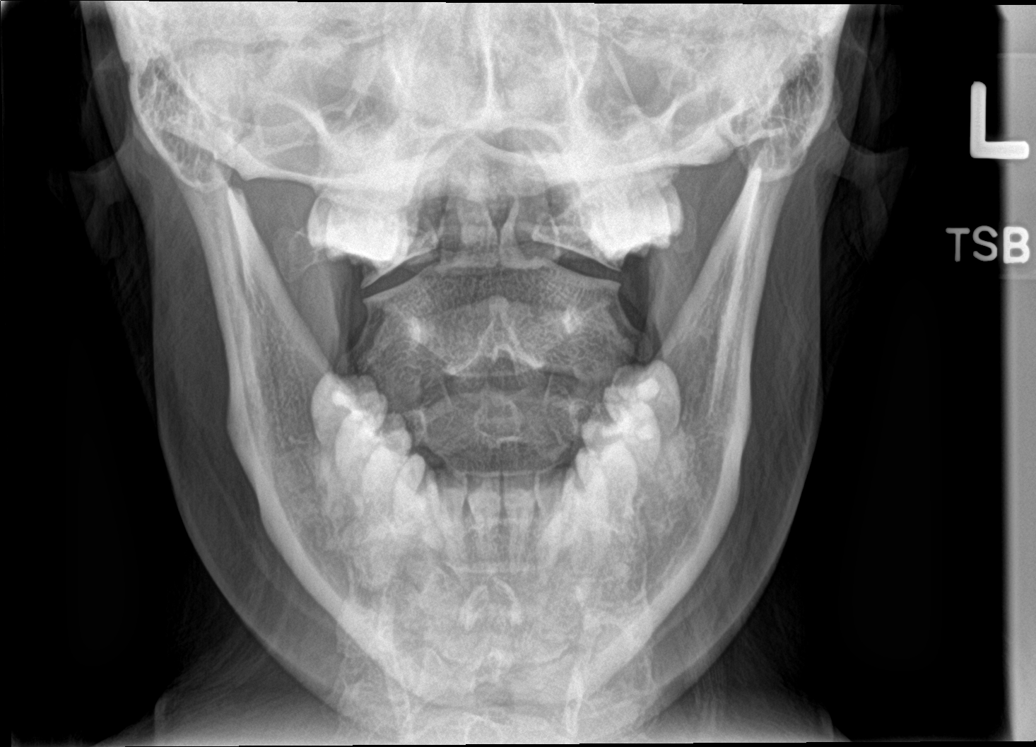

[[person_name]]
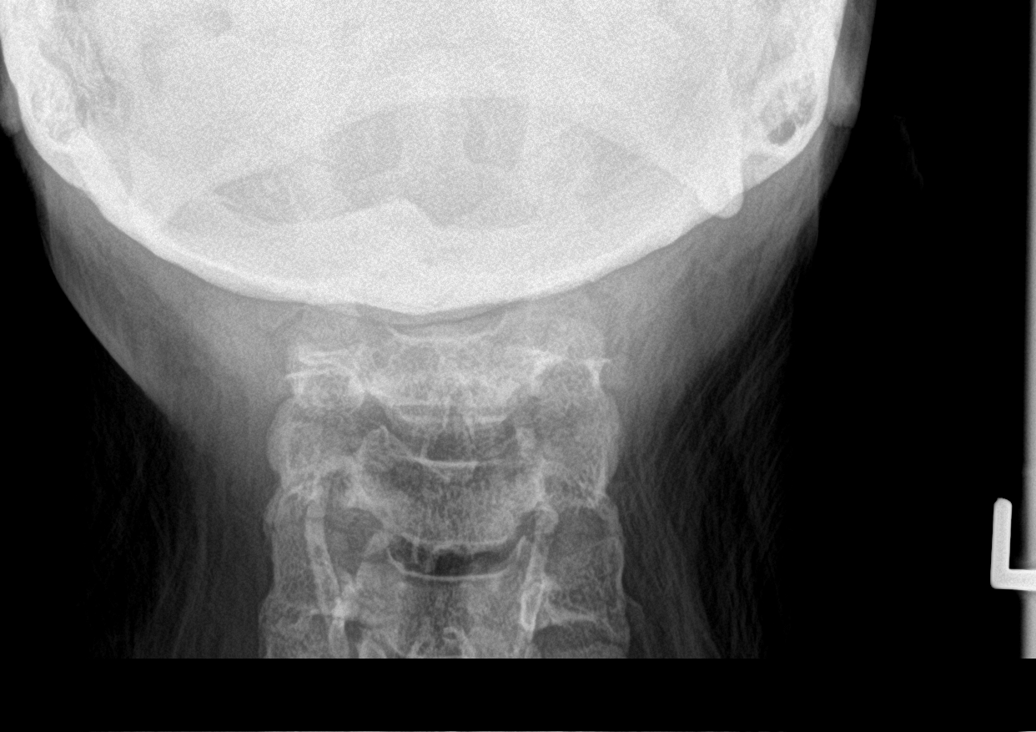

[c-spine obl (2 of 2)]
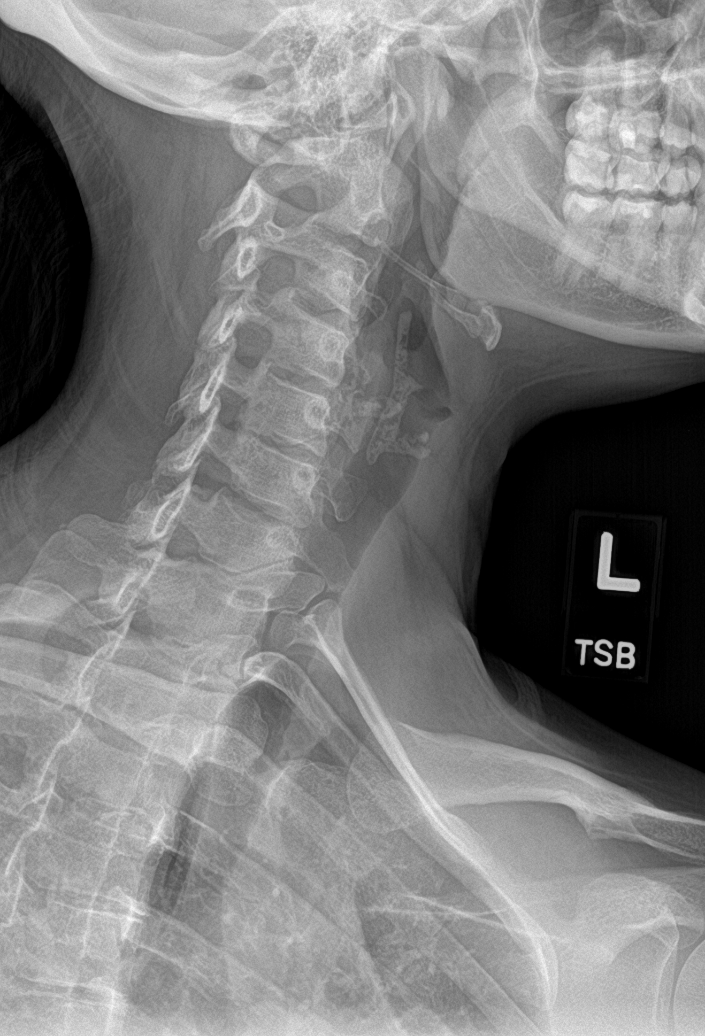

[6 of 6 positions shown; findings below may reference images not displayed]

FINDINGS: Alignment within normal limits. Mild disc space narrowing at C5-C6.
Vertebral body heights are maintained. Dens and lateral masses are
within normal limits. Left foraminal evaluation limited by
positioning. Possible mild right foraminal narrowing at C5-C6.
IMPRESSION: Mild degenerative change at C5-C6.
